# Patient Record
Sex: Female | Born: 1945 | Race: White | Hispanic: No | Marital: Married | Smoking: Former smoker
Health system: Southern US, Community
[De-identification: ages and names within clinical notes are randomized; demographics above are authoritative.]

## PROBLEM LIST (undated history)

## (undated) DIAGNOSIS — T4145XA Adverse effect of unspecified anesthetic, initial encounter: Secondary | ICD-10-CM

## (undated) DIAGNOSIS — E039 Hypothyroidism, unspecified: Secondary | ICD-10-CM

## (undated) DIAGNOSIS — E785 Hyperlipidemia, unspecified: Secondary | ICD-10-CM

## (undated) DIAGNOSIS — I10 Essential (primary) hypertension: Secondary | ICD-10-CM

## (undated) DIAGNOSIS — F419 Anxiety disorder, unspecified: Secondary | ICD-10-CM

## (undated) DIAGNOSIS — I639 Cerebral infarction, unspecified: Secondary | ICD-10-CM

## (undated) DIAGNOSIS — T8859XA Other complications of anesthesia, initial encounter: Secondary | ICD-10-CM

## (undated) DIAGNOSIS — G4733 Obstructive sleep apnea (adult) (pediatric): Secondary | ICD-10-CM

## (undated) DIAGNOSIS — M545 Low back pain: Secondary | ICD-10-CM

## (undated) HISTORY — PX: ABDOMINAL HYSTERECTOMY: SHX81

## (undated) HISTORY — PX: VARICOSE VEIN SURGERY: SHX832

## (undated) HISTORY — PX: CARPAL TUNNEL RELEASE: SHX101

## (undated) HISTORY — PX: REVISION TOTAL HIP ARTHROPLASTY: SHX766

## (undated) HISTORY — PX: TONSILLECTOMY: SUR1361

## (undated) HISTORY — PX: TOTAL HIP ARTHROPLASTY: SHX124

## (undated) HISTORY — DX: Low back pain: M54.5

---

## 2006-06-09 ENCOUNTER — Encounter: Admission: RE | Admit: 2006-06-09 | Discharge: 2006-06-09 | Payer: Self-pay | Admitting: Family Medicine

## 2007-06-17 ENCOUNTER — Encounter: Admission: RE | Admit: 2007-06-17 | Discharge: 2007-06-17 | Payer: Self-pay | Admitting: Family Medicine

## 2007-08-05 ENCOUNTER — Encounter: Admission: RE | Admit: 2007-08-05 | Discharge: 2007-08-05 | Payer: Self-pay | Admitting: Family Medicine

## 2007-08-18 ENCOUNTER — Ambulatory Visit (HOSPITAL_BASED_OUTPATIENT_CLINIC_OR_DEPARTMENT_OTHER): Admission: RE | Admit: 2007-08-18 | Discharge: 2007-08-18 | Payer: Self-pay | Admitting: Surgery

## 2007-08-18 ENCOUNTER — Encounter (INDEPENDENT_AMBULATORY_CARE_PROVIDER_SITE_OTHER): Payer: Self-pay | Admitting: Surgery

## 2008-06-21 ENCOUNTER — Encounter: Admission: RE | Admit: 2008-06-21 | Discharge: 2008-06-21 | Payer: Self-pay | Admitting: Family Medicine

## 2008-08-13 ENCOUNTER — Inpatient Hospital Stay (HOSPITAL_COMMUNITY): Admission: RE | Admit: 2008-08-13 | Discharge: 2008-08-18 | Payer: Self-pay | Admitting: Orthopedic Surgery

## 2009-04-03 ENCOUNTER — Ambulatory Visit (HOSPITAL_COMMUNITY): Admission: RE | Admit: 2009-04-03 | Discharge: 2009-04-03 | Payer: Self-pay | Admitting: Orthopedic Surgery

## 2009-04-29 ENCOUNTER — Encounter: Admission: RE | Admit: 2009-04-29 | Discharge: 2009-04-29 | Payer: Self-pay | Admitting: Internal Medicine

## 2009-08-05 ENCOUNTER — Encounter: Admission: RE | Admit: 2009-08-05 | Discharge: 2009-08-05 | Payer: Self-pay | Admitting: Orthopedic Surgery

## 2009-08-12 ENCOUNTER — Encounter: Admission: RE | Admit: 2009-08-12 | Discharge: 2009-08-12 | Payer: Self-pay | Admitting: Radiation Oncology

## 2009-11-02 ENCOUNTER — Emergency Department (HOSPITAL_COMMUNITY): Admission: EM | Admit: 2009-11-02 | Discharge: 2009-11-02 | Payer: Self-pay | Admitting: Emergency Medicine

## 2010-02-06 IMAGING — NM NM BONE 3 PHASE
1 series · 6 of 6 positions shown · non-contrast
Comparison: None

CLINICAL DATA: Right hip pain - evaluate for prosthetic loosening.
Right hip replacement July 2008.

NUCLEAR MEDICINE THREE PHASE BONE SCAN
TECHNIQUE: Radionuclide angiographic images, immediate static
blood pool images, and 3-hour delayed static images were obtained
after intravenous injection of radiopharmaceutical.
Radiopharmaceutical: 23.4 mCi technetium MDP

[Series 1: fl flow and static · 4.75mm/px · 6 of 41 frames shown]
[frame 4/41  full-range]
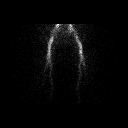
[frame 10/41  full-range]
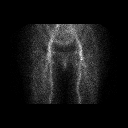
[frame 17/41  full-range]
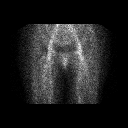
[frame 24/41  full-range]
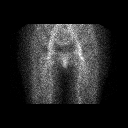
[frame 31/41  full-range]
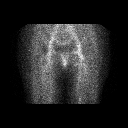
[frame 38/41  full-range]
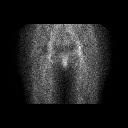

[6 of 6 positions shown; findings below may reference images not displayed]

FINDINGS: No pathological findings are noted in the vascular and
blood pool phases.

In the static phase, there is focal positivity at the tip of the
prosthesis in the mid shaft of the femur.  There is also perhaps
slight increased uptake in the area of the greater trochanter
adjacent to the prosthesis.

The scintigraphic findings are suspicious for prosthetic loosening.
However, bony remodeling can take place within the first year after
prosthesis, and so it is possible that this is a normal
postoperative finding.  It is difficult to make this distinction
only 8 months postoperative.  I would be happy to discussed this
with you in light of any other pertinent clinical data.
IMPRESSION: Focal positivity noted at the tip of the prosthesis, and in the
trochanteric region adjacent to the prosthesis - the findings are
suspicious but not definitive for prosthetic loosening.  See
report.

## 2010-04-30 ENCOUNTER — Encounter: Admission: RE | Admit: 2010-04-30 | Discharge: 2010-04-30 | Payer: Self-pay | Admitting: Internal Medicine

## 2010-05-26 ENCOUNTER — Encounter (INDEPENDENT_AMBULATORY_CARE_PROVIDER_SITE_OTHER): Payer: Self-pay | Admitting: Orthopedic Surgery

## 2010-05-26 ENCOUNTER — Inpatient Hospital Stay (HOSPITAL_COMMUNITY): Admission: RE | Admit: 2010-05-26 | Discharge: 2010-05-31 | Payer: Self-pay | Admitting: Orthopedic Surgery

## 2010-07-30 ENCOUNTER — Other Ambulatory Visit: Payer: Self-pay | Admitting: Internal Medicine

## 2010-07-30 DIAGNOSIS — R42 Dizziness and giddiness: Secondary | ICD-10-CM

## 2010-07-30 DIAGNOSIS — R519 Headache, unspecified: Secondary | ICD-10-CM

## 2010-07-30 DIAGNOSIS — R269 Unspecified abnormalities of gait and mobility: Secondary | ICD-10-CM

## 2010-08-01 ENCOUNTER — Inpatient Hospital Stay (HOSPITAL_COMMUNITY)
Admission: EM | Admit: 2010-08-01 | Discharge: 2010-08-06 | DRG: 533 | Disposition: A | Discharge status: Home Health | Payer: BC Managed Care – PPO | Attending: Internal Medicine | Admitting: Internal Medicine

## 2010-08-01 ENCOUNTER — Emergency Department (HOSPITAL_COMMUNITY): Payer: BC Managed Care – PPO

## 2010-08-01 ENCOUNTER — Ambulatory Visit
Admission: RE | Admit: 2010-08-01 | Discharge: 2010-08-01 | Disposition: A | Discharge status: Home / Self Care | Payer: BC Managed Care – PPO | Source: Ambulatory Visit

## 2010-08-01 DIAGNOSIS — Z780 Asymptomatic menopausal state: Secondary | ICD-10-CM

## 2010-08-01 DIAGNOSIS — E785 Hyperlipidemia, unspecified: Secondary | ICD-10-CM | POA: Diagnosis present

## 2010-08-01 DIAGNOSIS — Z79899 Other long term (current) drug therapy: Secondary | ICD-10-CM

## 2010-08-01 DIAGNOSIS — R42 Dizziness and giddiness: Secondary | ICD-10-CM

## 2010-08-01 DIAGNOSIS — Z7982 Long term (current) use of aspirin: Secondary | ICD-10-CM

## 2010-08-01 DIAGNOSIS — B004 Herpesviral encephalitis: Principal | ICD-10-CM | POA: Diagnosis present

## 2010-08-01 DIAGNOSIS — I6350 Cerebral infarction due to unspecified occlusion or stenosis of unspecified cerebral artery: Secondary | ICD-10-CM | POA: Diagnosis present

## 2010-08-01 DIAGNOSIS — E039 Hypothyroidism, unspecified: Secondary | ICD-10-CM | POA: Diagnosis present

## 2010-08-01 DIAGNOSIS — G43909 Migraine, unspecified, not intractable, without status migrainosus: Secondary | ICD-10-CM | POA: Diagnosis present

## 2010-08-01 DIAGNOSIS — I10 Essential (primary) hypertension: Secondary | ICD-10-CM | POA: Diagnosis present

## 2010-08-01 DIAGNOSIS — R269 Unspecified abnormalities of gait and mobility: Secondary | ICD-10-CM

## 2010-08-01 DIAGNOSIS — J42 Unspecified chronic bronchitis: Secondary | ICD-10-CM | POA: Diagnosis present

## 2010-08-01 DIAGNOSIS — F411 Generalized anxiety disorder: Secondary | ICD-10-CM | POA: Diagnosis present

## 2010-08-01 DIAGNOSIS — Z96649 Presence of unspecified artificial hip joint: Secondary | ICD-10-CM

## 2010-08-01 DIAGNOSIS — Z87891 Personal history of nicotine dependence: Secondary | ICD-10-CM

## 2010-08-01 DIAGNOSIS — G939 Disorder of brain, unspecified: Secondary | ICD-10-CM | POA: Diagnosis present

## 2010-08-01 LAB — DIFFERENTIAL
Eosinophils Relative: 6 % — ABNORMAL HIGH (ref 0–5)
Lymphocytes Relative: 45 % (ref 12–46)
Lymphs Abs: 2.9 10*3/uL (ref 0.7–4.0)
Monocytes Relative: 8 % (ref 3–12)

## 2010-08-01 LAB — COMPREHENSIVE METABOLIC PANEL
Alkaline Phosphatase: 76 U/L (ref 39–117)
BUN: 19 mg/dL (ref 6–23)
Chloride: 102 mEq/L (ref 96–112)
Creatinine, Ser: 0.96 mg/dL (ref 0.4–1.2)
Glucose, Bld: 103 mg/dL — ABNORMAL HIGH (ref 70–99)
Potassium: 3.5 mEq/L (ref 3.5–5.1)
Total Bilirubin: 0.3 mg/dL (ref 0.3–1.2)

## 2010-08-01 LAB — CBC
HCT: 37 % (ref 36.0–46.0)
MCH: 28.5 pg (ref 26.0–34.0)
MCV: 85.8 fL (ref 78.0–100.0)
RBC: 4.31 MIL/uL (ref 3.87–5.11)
RDW: 12.8 % (ref 11.5–15.5)
WBC: 6.5 10*3/uL (ref 4.0–10.5)

## 2010-08-01 LAB — APTT: aPTT: 28 seconds (ref 24–37)

## 2010-08-01 LAB — PROTIME-INR: INR: 0.99 (ref 0.00–1.49)

## 2010-08-01 MED ORDER — GADOBENATE DIMEGLUMINE 529 MG/ML IV SOLN
17.0000 mL | Freq: Once | INTRAVENOUS | Status: AC | PRN
  Administered 2010-08-01: 17 mL via INTRAVENOUS

## 2010-08-02 ENCOUNTER — Other Ambulatory Visit (HOSPITAL_COMMUNITY): Payer: BC Managed Care – PPO

## 2010-08-02 LAB — LIPID PANEL
LDL Cholesterol: 91 mg/dL (ref 0–99)
Triglycerides: 151 mg/dL — ABNORMAL HIGH (ref <150)

## 2010-08-02 LAB — T4, FREE: Free T4: 1.09 ng/dL (ref 0.80–1.80)

## 2010-08-02 LAB — TSH: TSH: 4.221 u[IU]/mL (ref 0.350–4.500)

## 2010-08-02 NOTE — H&P (Signed)
NAME:  Maureen Burch, Maureen Burch NO.:  [id number]  MEDICAL RECORD NO.:  [id number]           PATIENT TYPE:  I  LOCATION:  3002                         FACILITY:  [facility]  PHYSICIAN:  Hillery Aldo, M.D.   DATE OF BIRTH:  08-Jun-1946  DATE OF ADMISSION:  08/01/2010 DATE OF DISCHARGE:                             HISTORY & PHYSICAL   PRIMARY CARE PHYSICIAN:  Massie Maroon, MD  CHIEF COMPLAINT:  Headache x10 days.  HISTORY OF PRESENT ILLNESS:  The patient is a 65 year old female with past medical history of tobacco abuse, hypertension, and hyperlipidemia who presented to her PCP approximately 10 days ago with a chief complaint of headache.  The patient was felt to possibly have migraine- type headache and was treated with Topamax and Maxalt, but experienced no relief with these medications.  She denies any associated diplopia, focal neurologic deficits, speech or swallowing difficulty, but does report that in the past few months, she has had intermittent numbness and tingling involving her right upper extremity.  She also reports that earlier today, she had a 2-hour period of time where she noticed some left facial asymmetry and her left eyebrow not rising normally.  The patient's primary care physician sent her for an MRI scan due to ongoing problems with headaches and the MRI showed an acute versus subacute stroke involving the right temporal lobe.  She subsequently was instructed to come to the hospital for further evaluation and treatment.  PAST MEDICAL HISTORY: 1. Hypothyroidism. 2. Chronic bronchitis. 3. Hypertension. 4. Generalized anxiety disorder. 5. Dyslipidemia.  PAST SURGICAL HISTORY: 1. 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 2. Hysterectomy. 3. Appendectomy. 4. Tonsillectomy.  FAMILY HISTORY:  The patient's mother died at 14 from pneumonia.  The patient's father died at age 52 from cancer.  She has four siblings. Two brothers and two  sisters.  One sister has breast cancer and diabetes, but the others are healthy.  SOCIAL HISTORY:  The patient is married and lives with her husband in [place].  She works as an Retail buyer.  She has a history of tobacco abuse, but quit smoking approximately 2 years ago.  Drinks alcohol on only social occasions.  Denies drug use.  ALLERGIES:  No known drug allergies.  CURRENT MEDICATIONS: 1. Lisinopril 20 mg p.o. b.i.d. 2. Aspirin 81 mg p.o. daily. 3. Xanax 0.25 mg p.o. b.i.d. 4. Atenolol 25 mg p.o. daily. 5. Ventolin inhaler 2 puffs daily p.r.n. 6. Premarin 0.625 mg p.o. daily. 7. Synthroid 75 mcg p.o. daily. 8. Hydrochlorothiazide 25 mg p.o. daily. 9. Amitriptyline 25 mg p.o. b.i.d.  REVIEW OF SYSTEMS:  CONSTITUTIONAL:  No fever or chills.  No weight loss.  She does report diminished appetite and diminished energy. HEENT:  No complaints except her eyebrow being noticeably weak earlier today.  No dysphagia, dysphonia, or other symptoms.  CARDIOVASCULAR:  No chest pain or palpitations.  RESPIRATORY:  No dyspnea or cough.  GI:  No nausea, vomiting, diarrhea, melena, or hematochezia.  GU:  No dysuria.  All other systems reviewed and are negative.  PHYSICAL EXAMINATION:  VITAL SIGNS:  Temperature 97.9, pulse 58, respirations  16, blood pressure 140/77, O2 saturation 99% on room air. GENERAL:  Well-developed, well-nourished Caucasian female in no acute distress. HEENT:  Normocephalic, atraumatic.  PERRL.  EOMI.  Visual fields are full.  Tongue is midline.  Palate rises symmetrically.  Possible subtle left facial asymmetry. NECK:  Supple, no thyromegaly, no lymphadenopathy, no jugular venous distention. CHEST:  Lungs clear to auscultation bilaterally with good air movement. HEART:  Regular rate, rhythm.  No murmurs, rubs, or gallops. ABDOMEN:  Soft, nontender, nondistended with normoactive bowel sounds. EXTREMITIES:  No clubbing, edema, or cyanosis. SKIN:  Warm and dry.   No rashes.  Melanin pigmentation noted consistent with possible vitiligo. NEUROLOGIC:  The patient is alert and oriented x3.  Cranial nerves II through XII are grossly intact with the exception of a possible subtle left facial weakness.  No pronator drift.  Moves all extremities x4 with equal strength though the right lower extremity is somewhat weak secondary to her history of recent hip surgery.  DATA REVIEW:  MRI done earlier today shows abnormality in the medial right temporal lobe, most suggestive of acute/subacute infarcts. Prominent diffuse white matter type changes consistent with small vessel disease/chronic ischemia.  Paranasal sinus mucosal thickening with air- fluid levels left sphenoid sinus and left maxillary sinus.  LABORATORY DATA:  White blood cell count 6.5, hemoglobin 12.3, hematocrit 37, platelets 200.  All other labs are pending.  ASSESSMENT/PLAN:  Right cerebrovascular accident:  We will admit the patient to telemetry and obtain a neurological consultation.  Full stroke workup with a 12-lead EKG, two-dimensional echocardiogram, carotid Dopplers, transcranial Dopplers, and MRA of the brain has been requested.     Hillery Aldo, M.D.     CR/MEDQ  D:  08/01/2010  T:  08/02/2010  Job:  [id number]  cc:   Massie Maroon, MD  Electronically Signed by Hillery Aldo M.D. on 08/02/2010 01:39:32 PM

## 2010-08-02 NOTE — H&P (Signed)
  NAME:  Maureen Burch, Maureen Burch NO.:  [id number]  MEDICAL RECORD NO.:  [id number]           PATIENT TYPE:  I  LOCATION:  3002                         FACILITY:  [facility]  PHYSICIAN:  Hillery Aldo, M.D.   DATE OF BIRTH:  01/24/1946  DATE OF ADMISSION:  08/01/2010 DATE OF DISCHARGE:                             HISTORY & PHYSICAL   ADDENDUM  1. Hypertension:  Continue the patient's usual antihypertensives and     monitor her blood pressure closely. 2. History of dyslipidemia:  The patient is not currently on any     medications for lipid control.  We will check a fasting lipid panel     in the morning to ensure that she does not need further treatment. 3. Generalized anxiety disorder:  Continue the patient's Xanax. 4. Chronic bronchitis:  Continue the patient's Ventolin inhaler as     needed. 5. Hypothyroidism:  Check the patient's TSH, free T4, and continue her     on Synthroid therapy. 6. Postmenopausal status:  The patient has been on Premarin for     treatment of menopausal symptoms.  We will stop this given evidence     of CVA. 7. Prophylaxis:  We will place the patient on Lovenox subcutaneously     for DVT prophylaxis.  Time spent on admission including face-to-face time approximately 1 hour.     Hillery Aldo, M.D.     CR/MEDQ  D:  08/01/2010  T:  08/02/2010  Job:  [id number]  Electronically Signed by Hillery Aldo M.D. on 08/02/2010 01:39:38 PM

## 2010-08-03 LAB — URINALYSIS, ROUTINE W REFLEX MICROSCOPIC
Hgb urine dipstick: NEGATIVE
Ketones, ur: NEGATIVE mg/dL
Protein, ur: NEGATIVE mg/dL
Urine Glucose, Fasting: NEGATIVE mg/dL

## 2010-08-03 LAB — COMPREHENSIVE METABOLIC PANEL
ALT: 17 U/L (ref 0–35)
AST: 19 U/L (ref 0–37)
Albumin: 3 g/dL — ABNORMAL LOW (ref 3.5–5.2)
Alkaline Phosphatase: 61 U/L (ref 39–117)
GFR calc Af Amer: >60 mL/min (ref >60)
Potassium: 3.7 mEq/L (ref 3.5–5.1)
Sodium: 140 mEq/L (ref 135–145)
Total Protein: 5.9 g/dL — ABNORMAL LOW (ref 6.0–8.3)

## 2010-08-04 ENCOUNTER — Other Ambulatory Visit: Payer: Self-pay | Admitting: Radiology

## 2010-08-04 ENCOUNTER — Inpatient Hospital Stay (HOSPITAL_COMMUNITY): Payer: BC Managed Care – PPO

## 2010-08-04 LAB — CSF CELL COUNT WITH DIFFERENTIAL
RBC Count, CSF: 3 /mm3 — ABNORMAL HIGH
Tube #: 1

## 2010-08-04 LAB — ANGIOTENSIN CONVERTING ENZYME: Angiotensin-Converting Enzyme: 2 U/L — ABNORMAL LOW (ref 8–52)

## 2010-08-04 LAB — C3 COMPLEMENT: C3 Complement: 98 mg/dL (ref 88–201)

## 2010-08-04 LAB — SEDIMENTATION RATE: Sed Rate: 14 mm/hr (ref 0–22)

## 2010-08-04 LAB — C4 COMPLEMENT: Complement C4, Body Fluid: 19 mg/dL (ref 16–47)

## 2010-08-05 LAB — SJOGRENS SYNDROME-A EXTRACTABLE NUCLEAR ANTIBODY: SSA (Ro) (ENA) Antibody, IgG: 15 AU/mL (ref <30)

## 2010-08-06 ENCOUNTER — Other Ambulatory Visit: Payer: Self-pay

## 2010-08-06 LAB — ANTI-NEUTROPHIL ANTIBODY

## 2010-08-06 LAB — ANGIOTENSIN CONVERTING ENZYME, CSF: Angio Convert Enzyme: 5 U/L (ref <=15)

## 2010-08-06 LAB — COMPLEMENT, TOTAL: Compl, Total (CH50): 50 U/mL (ref 31–60)

## 2010-08-08 LAB — CSF CULTURE W GRAM STAIN: Culture: NO GROWTH

## 2010-08-08 LAB — HERPES SIMPLEX VIRUS(HSV) DNA BY PCR: HSV 1 DNA: NOT DETECTED

## 2010-08-09 LAB — OLIGOCLONAL BANDS, CSF + SERM

## 2010-08-12 ENCOUNTER — Other Ambulatory Visit: Payer: Self-pay | Admitting: Internal Medicine

## 2010-08-13 LAB — CNS IGG SYNTHESIS RATE, CSF+BLOOD
IgG Index, CSF: 0.5 (ref <0.66)
IgG, CSF: 1.9 mg/dL (ref 0.8–7.7)

## 2010-08-18 ENCOUNTER — Ambulatory Visit
Admission: RE | Admit: 2010-08-18 | Discharge: 2010-08-18 | Disposition: A | Discharge status: Home / Self Care | Payer: BC Managed Care – PPO | Source: Ambulatory Visit | Attending: Internal Medicine | Admitting: Internal Medicine

## 2010-08-18 MED ORDER — GADOBENATE DIMEGLUMINE 529 MG/ML IV SOLN
16.0000 mL | Freq: Once | INTRAVENOUS | Status: AC | PRN
  Administered 2010-08-18: 16 mL via INTRAVENOUS

## 2010-08-20 NOTE — Discharge Summary (Signed)
NAME:  Maureen, Burch NO.:  [id number]  MEDICAL RECORD NO.:  [id number]           PATIENT TYPE:  I  LOCATION:  3002                         FACILITY:  [facility]  PHYSICIAN:  Massie Maroon, MD        DATE OF BIRTH:  17-Nov-1945  DATE OF ADMISSION:  08/01/2010 DATE OF DISCHARGE:                              DISCHARGE SUMMARY   DISCHARGE DIAGNOSES: 1. Gyriform contrast enhancement of the right medial temporal lobe,     most likely differential diagnosis acute or subacute infarction,     possible herpes encephalitis or mass lesion in the differential. 2. Headache, likely secondary to cerebrovascular accident or     inflammation. 3. Hypertension. 4. Hyperlipidemia. 5. Hypothyroidism. 6. General anxiety disorder. 7. 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 8. Hysterectomy. 9. Appendectomy. 10.Tonsillectomy.  PROCEDURES: 1. MRI of brain without contrast on August 01, 2010, abnormality in     the right medial temporal lobe suggestive of acute or subacute     infarct.  August 02, 2010, MRI of brain with contrast, gyriform     enhancement of the medial temporal lobe on the right, most likely     acute or subacute infarction, differential herpes encephalitis or     mass lesion.  MRA on August 02, 2010, negative MRA. 2. Lumbar puncture on August 04, 2010.  CONSULTANTS: 1. Joycelyn Schmid, MD. 2. Reinaldo Meeker, MD.  DISCHARGE MEDICATIONS: 1. Elavil 50 mg p.o. at bedtime. 2. Enteric-coated aspirin 325 mg p.o. daily. 3. Vicodin 5/325 one to two p.o. q.4 h. p.r.n. headache. 4. Lisinopril 5 mg p.o. b.i.d. 5. Crestor 5 mg p.o. daily. 6. Senna-S tablet one p.o. at bedtime p.r.n. 7. Xanax 0.25 mg p.o. b.i.d. 8. Elavil 25 mg two p.o. at bedtime. 9. Atenolol 25 mg one p.o. b.i.d. 10.Coenzyme Q10 one p.o. daily. 11.Fish oil two p.o. daily. 12.Levothyroxine 75 mcg p.o. daily. 13.Premarin 0.625 mg one p.o. q.a.m. 14.Singulair 10 mg p.o.  daily.  HOSPITAL COURSE:  A 65 year old right-handed female with a history of hypertension, migraine headache apparently was in her normal state of health until about approximately 10 days ago when she complained of headache primarily.  Prior to that, she had reported some intermittent numbness and tingling of her right upper extremity and some blurred vision and diaphoresis [place] the post office.  The patient was seen by Dr. Selena Batten and an attempt was made to treat her headaches with Maxalt and topiramate, which did not relieve her headaches.  Headache was progressively worsened.  Imaging was performed, which showed possible acute/subacute stroke, differential diagnosis being inflammatory process versus tumor.  Neurology was consulted and thought that later in the course of her hospitalization that her symptoms were atypical for stroke and a lumbar puncture was performed on August 04, 2010.  Some of the results are still outstanding, however, today HIV antibody test, complement levels, protein, glucose, cell count, diff, Gram stain and culture, cryptococcal antigen, ACE converting enzyme, fungal culture, ANA were negative.  The patient has had some difficulty with gait on day two of admission, however, this has improved and she is  almost back to her baseline.  Dr. Gerlene Fee also consulted on this case and recommended followup MRI in about 3-4 weeks to rule out tumor.  He thought tumor was of low probability.  No neurosurgical intervention at this time.  The patient is able to walk with walker and appears steady.  PHYSICAL EXAM:  VITAL SIGNS:  Temperature 97.8, pulse 63, blood pressure 114/77, pulse ox 96% on room air. HEENT:  Anicteric. NECK:  No JVD.  No bruit. HEART:  Regular rate and rhythm, S1 and S2. LUNGS:  Clear to auscultation bilaterally. ABDOMEN:  Soft, nontender, nondistended.  Positive bowel sounds. EXTREMITIES:  No cyanosis, clubbing, or edema. SKIN:  No rashes, evidence of  vitiligo.  LABORATORY DATA:  Labs, August 01, 2010, hemoglobin A1c 5.3.  August 01, 2010, TSH 4.221.  August 04, 2010, ESR 14.  C-reactive protein 1.1. HIV antibody test nonreactive.  C3 of 98, C4 of 19.  Protein and glucose from the CSF 52 and 24 respectively (normal).  Cell count; WBC is 1, RBC is 3.  Cryptococcal antigen negative.  ACE converting enzyme 2.  Fungal culture negative.  ANA negative.  ANCA pending.  Outstanding are oligoclonal bands, herpes simplex virus, CNS IgG synthesis rate.  ASSESSMENT/PLAN:  Right medial temporal lobe abnormality, likely acute or subacute infarct, differential diagnosis inflammatory process or tumor.  Repeat MRI suggested by Neurosurgery in 3-4 weeks.  We will arrange this as an outpatient.  Obviously, we are awaiting some of the CSF results and these can be followed up as an outpatient.  We greatly appreciate Dr. Satira Sark Penumalli's input as well as Dr. Trudee Grip.  The patient will follow up with Dr. Selena Batten in 1 week for followup of CSF test results and Dr. Joycelyn Schmid in 1-2 weeks as well.     Massie Maroon, MD     JYK/MEDQ  D:  08/06/2010  T:  08/06/2010  Job:  [id number]  Electronically Signed by Pearson Grippe MD on 08/20/2010 06:44:19 AM

## 2010-08-21 LAB — VIRUS CULTURE

## 2010-09-01 LAB — FUNGUS CULTURE W SMEAR

## 2010-09-01 NOTE — Consult Note (Signed)
NAME:  Maureen, Burch NO.:  [id number]  MEDICAL RECORD NO.:  [id number]           PATIENT TYPE:  E  LOCATION:  [facility]                         FACILITY:  [facility]  PHYSICIAN:  Joycelyn Schmid, MD   DATE OF BIRTH:  26-Feb-1946  DATE OF CONSULTATION:  08/01/2010 DATE OF DISCHARGE:                                CONSULTATION   CHIEF COMPLAINT:  Headache, blurred vision, abnormal MRI of brain.  HISTORY OF PRESENT ILLNESS:  A 65 year old right-handed female with history of hypertension, migraine headache, "irregular heartbeat," was in normal state of health until July 23, 2010, when she was [place] the post office, leaned over and seat back up, developed diffuse blurred vision and feeling sweaty sensation over her face.  She then developed right posterior headache that was intermittent.  The headache was intermittent over the next several days and stronger in intensity than her usual migraine headache.  She had no nausea or vomiting.  Her headaches also would wake her up in the middle of night.  This was atypical from her usual migraine headaches.  She saw her primary care physician Dr. Selena Batten who prescribed Maxalt and topiramate, which did not relieve for headaches.  She also developed abnormal tingling sensation in the bilateral hands after starting topiramate.  Headache progressively got worse and she also had some balance difficulty, her husband who is here in the room with her has also noted some mild intermittent memory difficulties over the past 1-2 months, but the patient adamantly denies that she has had any memory difficulties.  She denies any other focal or sudden onset of symptoms such as numbness, weakness, slurred speech, language difficulty or vision abnormalities.  PAST MEDICAL HISTORY: 1. Hypertension. 2. "Irregular heart rhythm," for which, she is treated with atenolol,     alprazolam, and amitriptyline. 3. History of migraine headaches since age 56 years  old. 4. Right hip replacement surgeries in 2010 and 2011.  MEDICATIONS: 1. Hydrochlorothiazide. 2. Atenolol. 3. Lisinopril. 4. Alprazolam. 5. Amitriptyline. 6. Aspirin 81 mg daily. 7. Levothyroxine. 8. Ventolin inhaler. 9. Singulair.  ALLERGIES:  No known drug allergies.  FAMILY HISTORY:  None.  SOCIAL HISTORY:  The patient quit smoking in 2009, quit alcohol in 2008. No illicit drug use.  REVIEW OF SYSTEMS:  The patient has had some weight gain recently since she has not been able to exercise with her hip surgeries.  She denies any night sweats, denies chest pain or shortness of breath.  Denies any rash.  She has been extremely fatigued over the past 10 days.  Other full 14 system review is negative.  PHYSICAL EXAMINATION:  VITAL SIGNS:  Blood pressure 140/77, pulse of 54, respirations 21, temperature 97.9 degrees, 99% of room air. GENERAL EXAMINATION:  She is awake and alert.  Language is fluent. Comprehension intact.  Mental status; no evidence of aphasia.  She is able to read sentences and words without difficulty, able to describe name, objects and describe the NIH stroke scale, cookie-theft picture. Cranial nerve examination; no papilledema on funduscopic exam.  Pupils are reactive 3-2 mm.  Visual fields full to accommodation.  Extraocular muscles  intact.  No nystagmus.  Facial sensation and strength are symmetric.  Uvula midline.  Shoulder shrug is symmetric.  Tongue is midline.  Motor examination; normal bulk and tone, 5/5 strength in bilateral upper and lower extremities.  Sensory examination; intact to light touch, temperature, vibration.  Reflexes 1+ in the upper and lower extremities.  Downgoing toes.  Coordination testing; finger-nose-finger smooth and symmetric.  Gait testing; the patient is slow and cautious to sit up at the side of the bed and stand up.  She is able to walk on her own power back-and-forth to the room, although she is quite slow and careful  with her walking.  Her husband notes that this is abnormal for her usual mobility. CARDIOVASCULAR:  Regular rate and rhythm.  No murmurs.  No carotid bruits.  LAB TESTING:  Sodium 138, BUN 19, creatinine 0.96, glucose 103.  White count 6.5, platelets 200.  Troponin 0.03.  LFTs normal.  MRI scan of brain, which I reviewed, shows a right mesial temporal lobe lesion with some components of restricted fusion and vasogenic edema.  Mesial temporal lobe structure appears slightly enlarged as compared to the left side.  There are also numerous small T2 hyperintensity scattered throughout the bilateral cerebral hemispheres and brainstem.  ASSESSMENT AND RECOMMENDATIONS:  A 65 year old female with progressive intermittent headache and malaise.  No focal findings.  She had an abnormal MRI scan.  Differential diagnosis includes central nervous system neoplasm, inflammatory lesion, autoimmune condition, stroke. Based on the patient's symptoms and neurologic exam, I am less convinced that this is an acute ischemic infarction or acute vascular event and much more concerned for in inflammatory or neoplastic process.  RECOMMENDATIONS: 1. I agree with completing stroke workup with transthoracic     echocardiogram, carotid ultrasound, fasting lipid profile, MRA of     the head. 2. Add on postcontrast use of MRI of the brain. 3. Headache control with pain medications.  Would try ibuprofen,     Tylenol, Fioricet, tramadol, Toradol, other opiates as needed. 4. At some point, the patient should have a neurosurgical consultation     if the above workup is completed and unremarkable, and unrevealing     for the diagnosis, this could probably be done on an outpatient     basis.     Joycelyn Schmid, MD    VP/MEDQ  D:  08/01/2010  T:  08/01/2010  Job:  [id number]  Electronically Signed by Joycelyn Schmid  on 09/01/2010 12:33:33 PM

## 2010-09-04 ENCOUNTER — Other Ambulatory Visit: Payer: Self-pay | Admitting: Internal Medicine

## 2010-09-04 DIAGNOSIS — Z1231 Encounter for screening mammogram for malignant neoplasm of breast: Secondary | ICD-10-CM

## 2010-09-09 LAB — CBC
HCT: 26.6 % — ABNORMAL LOW (ref 36.0–46.0)
Hemoglobin: 11.6 g/dL — ABNORMAL LOW (ref 12.0–15.0)
Hemoglobin: 8.8 g/dL — ABNORMAL LOW (ref 12.0–15.0)
MCH: 30.1 pg (ref 26.0–34.0)
MCH: 30.3 pg (ref 26.0–34.0)
MCHC: 33.1 g/dL (ref 30.0–36.0)
Platelets: 154 10*3/uL (ref 150–400)
Platelets: 171 10*3/uL (ref 150–400)
Platelets: 180 10*3/uL (ref 150–400)
Platelets: 269 10*3/uL (ref 150–400)
RBC: 2.64 MIL/uL — ABNORMAL LOW (ref 3.87–5.11)
RBC: 2.89 MIL/uL — ABNORMAL LOW (ref 3.87–5.11)
RBC: 2.95 MIL/uL — ABNORMAL LOW (ref 3.87–5.11)
RBC: 3.86 MIL/uL — ABNORMAL LOW (ref 3.87–5.11)
RDW: 12.8 % (ref 11.5–15.5)
RDW: 13 % (ref 11.5–15.5)
WBC: 6.4 10*3/uL (ref 4.0–10.5)
WBC: 9.2 10*3/uL (ref 4.0–10.5)

## 2010-09-09 LAB — PROTIME-INR
INR: 0.91 (ref 0.00–1.49)
INR: 1.13 (ref 0.00–1.49)
INR: 1.19 (ref 0.00–1.49)
INR: 1.56 — ABNORMAL HIGH (ref 0.00–1.49)
INR: 1.63 — ABNORMAL HIGH (ref 0.00–1.49)
INR: 2.06 — ABNORMAL HIGH (ref 0.00–1.49)
Prothrombin Time: 12.5 seconds (ref 11.6–15.2)
Prothrombin Time: 14.7 seconds (ref 11.6–15.2)
Prothrombin Time: 15.3 seconds — ABNORMAL HIGH (ref 11.6–15.2)
Prothrombin Time: 16.7 seconds — ABNORMAL HIGH (ref 11.6–15.2)
Prothrombin Time: 18.9 seconds — ABNORMAL HIGH (ref 11.6–15.2)
Prothrombin Time: 19.5 seconds — ABNORMAL HIGH (ref 11.6–15.2)
Prothrombin Time: 23.4 seconds — ABNORMAL HIGH (ref 11.6–15.2)

## 2010-09-09 LAB — TYPE AND SCREEN
ABO/RH(D): A POS
Antibody Screen: NEGATIVE
Unit division: 0
Unit division: 0

## 2010-09-09 LAB — URINALYSIS, ROUTINE W REFLEX MICROSCOPIC
Bilirubin Urine: NEGATIVE
Glucose, UA: NEGATIVE mg/dL
Hgb urine dipstick: NEGATIVE
Protein, ur: NEGATIVE mg/dL

## 2010-09-09 LAB — BASIC METABOLIC PANEL
BUN: 12 mg/dL (ref 6–23)
CO2: 28 mEq/L (ref 19–32)
CO2: 30 mEq/L (ref 19–32)
Calcium: 8.1 mg/dL — ABNORMAL LOW (ref 8.4–10.5)
Calcium: 8.3 mg/dL — ABNORMAL LOW (ref 8.4–10.5)
Calcium: 8.4 mg/dL (ref 8.4–10.5)
Calcium: 9.8 mg/dL (ref 8.4–10.5)
Chloride: 106 mEq/L (ref 96–112)
Creatinine, Ser: 0.86 mg/dL (ref 0.4–1.2)
Creatinine, Ser: 0.92 mg/dL (ref 0.4–1.2)
Creatinine, Ser: 0.94 mg/dL (ref 0.4–1.2)
GFR calc Af Amer: >60 mL/min (ref >60)
GFR calc Af Amer: >60 mL/min (ref >60)
GFR calc non Af Amer: 60 mL/min — ABNORMAL LOW (ref >60)
GFR calc non Af Amer: >60 mL/min (ref >60)
GFR calc non Af Amer: >60 mL/min (ref >60)
Glucose, Bld: 109 mg/dL — ABNORMAL HIGH (ref 70–99)
Sodium: 137 mEq/L (ref 135–145)

## 2010-09-09 LAB — DIFFERENTIAL
Basophils Relative: 0 % (ref 0–1)
Eosinophils Absolute: 0.5 10*3/uL (ref 0.0–0.7)
Lymphs Abs: 4.3 10*3/uL — ABNORMAL HIGH (ref 0.7–4.0)
Monocytes Relative: 8 % (ref 3–12)
Neutro Abs: 3.5 10*3/uL (ref 1.7–7.7)
Neutrophils Relative %: 39 % — ABNORMAL LOW (ref 43–77)

## 2010-09-09 LAB — ANAEROBIC CULTURE

## 2010-09-09 LAB — BODY FLUID CULTURE: Culture: NO GROWTH

## 2010-09-09 LAB — SURGICAL PCR SCREEN: Staphylococcus aureus: NEGATIVE

## 2010-09-09 LAB — APTT: aPTT: 25 seconds (ref 24–37)

## 2010-09-11 ENCOUNTER — Ambulatory Visit
Admission: RE | Admit: 2010-09-11 | Discharge: 2010-09-11 | Disposition: A | Discharge status: Home / Self Care | Payer: BC Managed Care – PPO | Source: Ambulatory Visit | Attending: Internal Medicine | Admitting: Internal Medicine

## 2010-09-11 DIAGNOSIS — Z1231 Encounter for screening mammogram for malignant neoplasm of breast: Secondary | ICD-10-CM

## 2010-09-16 LAB — COMPREHENSIVE METABOLIC PANEL
ALT: 19 U/L (ref 0–35)
AST: 21 U/L (ref 0–37)
CO2: 30 mEq/L (ref 19–32)
Calcium: 9.5 mg/dL (ref 8.4–10.5)
GFR calc Af Amer: >60 mL/min (ref >60)
Sodium: 138 mEq/L (ref 135–145)
Total Protein: 6.8 g/dL (ref 6.0–8.3)

## 2010-09-16 LAB — DIFFERENTIAL
Basophils Absolute: 0 10*3/uL (ref 0.0–0.1)
Basophils Relative: 0 % (ref 0–1)
Lymphocytes Relative: 42 % (ref 12–46)

## 2010-09-16 LAB — URINALYSIS, ROUTINE W REFLEX MICROSCOPIC
Hgb urine dipstick: NEGATIVE
Nitrite: NEGATIVE
Protein, ur: NEGATIVE mg/dL
Specific Gravity, Urine: 1.011 (ref 1.005–1.030)
Urobilinogen, UA: 0.2 mg/dL (ref 0.0–1.0)

## 2010-09-16 LAB — CBC
MCHC: 33.8 g/dL (ref 30.0–36.0)
RBC: 4.23 MIL/uL (ref 3.87–5.11)
RDW: 12.4 % (ref 11.5–15.5)

## 2010-09-16 LAB — LIPASE, BLOOD: Lipase: 41 U/L (ref 11–59)

## 2010-10-14 LAB — CBC
HCT: 23.6 % — ABNORMAL LOW (ref 36.0–46.0)
HCT: 30.6 % — ABNORMAL LOW (ref 36.0–46.0)
Hemoglobin: 10.2 g/dL — ABNORMAL LOW (ref 12.0–15.0)
MCHC: 34.6 g/dL (ref 30.0–36.0)
MCHC: 34.9 g/dL (ref 30.0–36.0)
MCHC: 34.9 g/dL (ref 30.0–36.0)
MCHC: 35.3 g/dL (ref 30.0–36.0)
MCV: 91.3 fL (ref 78.0–100.0)
MCV: 91.9 fL (ref 78.0–100.0)
Platelets: 146 10*3/uL — ABNORMAL LOW (ref 150–400)
Platelets: 146 10*3/uL — ABNORMAL LOW (ref 150–400)
Platelets: 186 10*3/uL (ref 150–400)
RBC: 4.4 MIL/uL (ref 3.87–5.11)
RBC: 3.21 MIL/uL — ABNORMAL LOW (ref 3.87–5.11)
RDW: 12.4 % (ref 11.5–15.5)
WBC: 6.7 10*3/uL (ref 4.0–10.5)
WBC: 8.9 10*3/uL (ref 4.0–10.5)

## 2010-10-14 LAB — URINALYSIS, ROUTINE W REFLEX MICROSCOPIC
Bilirubin Urine: NEGATIVE
Glucose, UA: NEGATIVE mg/dL
Ketones, ur: NEGATIVE mg/dL
Nitrite: NEGATIVE
Protein, ur: NEGATIVE mg/dL
Urobilinogen, UA: 0.2 mg/dL (ref 0.0–1.0)
pH: 5.5 (ref 5.0–8.0)

## 2010-10-14 LAB — PROTIME-INR
INR: 0.9 (ref 0.00–1.49)
INR: 2 — ABNORMAL HIGH (ref 0.00–1.49)
INR: 2 — ABNORMAL HIGH (ref 0.00–1.49)
INR: 2.3 — ABNORMAL HIGH (ref 0.00–1.49)
Prothrombin Time: 18.8 seconds — ABNORMAL HIGH (ref 11.6–15.2)
Prothrombin Time: 27 seconds — ABNORMAL HIGH (ref 11.6–15.2)

## 2010-10-14 LAB — BASIC METABOLIC PANEL
BUN: 18 mg/dL (ref 6–23)
CO2: 30 mEq/L (ref 19–32)
Calcium: 10 mg/dL (ref 8.4–10.5)
Chloride: 94 mEq/L — ABNORMAL LOW (ref 96–112)
Creatinine, Ser: 0.89 mg/dL (ref 0.4–1.2)
GFR calc Af Amer: >60 mL/min (ref >60)
GFR calc non Af Amer: >60 mL/min (ref >60)
Glucose, Bld: 144 mg/dL — ABNORMAL HIGH (ref 70–99)
Potassium: 3.9 mEq/L (ref 3.5–5.1)
Sodium: 138 mEq/L (ref 135–145)

## 2010-10-14 LAB — APTT: aPTT: 26 seconds (ref 24–37)

## 2010-10-14 LAB — DIFFERENTIAL
Basophils Absolute: 0 10*3/uL (ref 0.0–0.1)
Basophils Relative: 0 % (ref 0–1)
Lymphocytes Relative: 44 % (ref 12–46)
Monocytes Relative: 7 % (ref 3–12)
Neutro Abs: 4.1 10*3/uL (ref 1.7–7.7)
Neutrophils Relative %: 45 % (ref 43–77)

## 2010-10-14 LAB — CROSSMATCH
ABO/RH(D): A POS
Antibody Screen: NEGATIVE

## 2010-11-11 NOTE — Op Note (Signed)
NAME:  Maureen Burch, ZIPP NO.:  [id number]   MEDICAL RECORD NO.:  [id number]          PATIENT TYPE:  INP   LOCATION:  5001                         FACILITY:  [facility]   PHYSICIAN:  Feliberto Gottron. Turner Daniels, M.D.   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   PROCEDURE:  Right total hip arthroplasty using a DePuy 50-mm ASR cup, S-  ROM 18 x 13 x 160 x 42 stem, 18B small cone, NK+0, and 45-mm ultimate  ball.   SURGEON:  Feliberto Gottron. Turner Daniels, MD   FIRST ASSISTANT:  Shirl Harris, PA-C   ANESTHETIC:  General endotracheal.   ESTIMATED BLOOD LOSS:  400 mL   FLUID REPLACEMENT:  1800 mL of crystalloid.   DRAINS PLACED:  Foley catheter.   URINE OUTPUT:  300 mL.   INDICATIONS FOR PROCEDURE:  A 65 year old woman with bilateral early  protrusio of both hips with increasing pain, right greater than left,  failed conservative treatment with anti-inflammatory medicines,  exercises, attempts at weight loss, and use of a cane, desires elective  right total hip arthroplasty, and then possibly left total hip  arthroplasty after she gets over this.  Risks and benefits of surgery  have been discussed.  Questions have been answered.   DESCRIPTION OF PROCEDURE:  The patient was identified by armband and  received 2 g of Ancef IV in the preop holding area.  She was then taken  to operating room for where the appropriate anesthetic monitors were  attached, and general endotracheal anesthesia induced with the patient  in the supine position.  She was then rolled into the left lateral  decubitus position, and the right lower extremity was prepped and draped  in usual sterile fashion from the ankle to  hemipelvis after first  inserting a Foley catheter.  After she had been rolled into the left  lateral decubitus position, she was fixed there with a Stulberg Mark II  pelvic clamp, keeping her pelvis vertical, and then she was prepped and  draped.  Standard time-out procedure was then performed, skin along the  lateral hip and thigh was infiltrated with 20 mL of 0.5% Marcaine and  epinephrine solution, and we began the operation itself by making a 15-  cm incision centered over the greater trochanter through the skin and  subcutaneous tissue down to the level of the IT band, which was cut in  line with the skin incision exposing the greater trochanter.  Cobra  retractors were then placed between the quadratus femoris and the  inferior hip joint capsule and the gluteus minimus and the superior hip  joint capsule.  Short external rotators and piriformis were tagged with  a #2 Ethibond suture and cut off their insertion on the  intertrochanteric crest exposing the hip joint capsule, which was then  developed into an  acetabular based flap going from posterior-superior on  the rim of the acetabulum out over the base of the femoral neck and out  posterior-inferior along the acetabulum, and the flap was tagged with  two #2 Ethibond sutures.  Bleeders along the intertrochanteric crest  were cauterized.  The hip was flexed and internally rotated dislocating  the arthritic femoral head, which had a high varus configuration, and a  standard neck cut performed one fingerbreadth above the lesser  trochanter.  The proximal femur was then translated anteriorly levering  off the superior anterior column of Hohmann retractor.  A spiked Cobra  retractor was placed in the cotyloid notch inferiorly and finally a  posterior-inferior wing retractor was placed in the junction of the  ischium and acetabulum.  This allowed removal of the labrum, although  much of it was actually calcified from the protrusio  deformity, and had  to be removed with rongeurs as apposed to electrocautery.  We then  sequentially reamed the acetabulum up to a 49 basket reamer obtaining  good coverage in all quadrants just getting into the subchondral bone.  The edges were attached to the 50-mm basket reamer and then we selected  a 50-mm ASR cup after first irrigating out the acetabulum with normal  saline solution, the cup was inserted in 45 degrees of abduction and 50-  20 degrees of anteversion, and had an excellent press fit.  You could  pretty much lift the pelvis off the table with the inserter attached.  The hip was then flexed and internally rotated exposing the proximal  femur, which was entered with the round box cutting chisel followed by  the initiating reamer and followed by axial reaming up to a 13-mm axial  reamer, obtaining good chatter at 12.5 and 13 and then finally going to  13.5.  We went part way down with a 14-mm reamer and then reamed the  cone up to a 18B cone and milled the calcar to a B small calcar.  An 18B  small trial was then hammered into place to the appropriate depth for 42  base neck and a trial stem with a 42 base was inserted with the same  version as the calcar about 20 degrees and an NK +0, 45-mm trial.  The  hip was reduced, could not be dislocated in external rotation and  extension and in flexion, and internal rotation flexed to 90, internally  rotate to about 75 or 80 before any instability was noted.  At this  point, the trial components were removed and the wound was hand lavaged  with normal saline solution, and an 18 B small ZTT1 cone was hammered  into place.  We reamed one more time to 13.5 through the cone and then  inserted an 18 x 13 x 160 x 42 stem in the same version as the cone.  Once this was seated, an NK +0, 45-mm ultimate ball was hammered on the  stem.  The hip once again reduced and stability noted to be excellent.  The wound was hand lavaged one more time.   The capsular flap and short  external rotators were repaired back to the intertrochanteric crest  through drill holes.  The IT band was closed with a running #1 Vicryl  suture.  The subcutaneous tissue with 0 and 2-0 undyed Vicryl suture,  and the skin with skin staples.  A dressing of Xeroform and Mepilex was  then applied.  The patient was unclamped, rolled supine,  awakened, and  taken to the recovery room without difficulty.      Feliberto Gottron. Turner Daniels, M.D.  Electronically Signed     FJR/MEDQ  D:  08/13/2008  T:  08/13/2008  Job:  [id number]

## 2010-11-11 NOTE — Op Note (Signed)
NAME:  MAKENSEY, REGO NO.:  [id number]   MEDICAL RECORD NO.:  [id number]          PATIENT TYPE:  AMB   LOCATION:  DSC                          FACILITY:  [facility]   PHYSICIAN:  Currie Paris, M.D.DATE OF BIRTH:  June 15, 1946   DATE OF PROCEDURE:  08/18/2007  DATE OF DISCHARGE:                               OPERATIVE REPORT   PREOPERATIVE DIAGNOSIS:  Epidermoid cyst of back.   POSTOPERATIVE DIAGNOSIS:  Epidermoid cyst of back.   OPERATION:  Removal of epidermoid cyst of back.   SURGEON:  Currie Paris, M.D.   ANESTHESIA:  Local.   CLINICAL HISTORY:  This patient has a gradually enlarging cyst that she  wished to have removed.   DESCRIPTION OF PROCEDURE:  The patient was seen in the minor procedure  room and we confirmed the plans for the procedure.  The cyst itself was  identified and marked.   The patient was given local anesthesia using 1% Xylocaine with epi.  I  waited about 10 minutes for this to work and then prepped the area with  Betadine.  We had already done the time-out prior to local anesthesia  administration.   I made a transverse elliptical incision.  The epidermoid cyst was  excised in toto and intact.  There was no significant bleeding.   The incision was closed with 3-0 Vicryl, 4-0 Monocryl subcuticular and  Dermabond.   The patient tolerated the procedure well.      Currie Paris, M.D.  Electronically Signed     CJS/MEDQ  D:  08/18/2007  T:  08/19/2007  Job:  [id number]

## 2010-11-14 NOTE — Discharge Summary (Signed)
NAME:  Maureen Burch, Maureen Burch NO.:  [id number]   MEDICAL RECORD NO.:  [id number]          PATIENT TYPE:  INP   LOCATION:  5001                         FACILITY:  [facility]   PHYSICIAN:  Feliberto Gottron. Turner Daniels, M.D.   DATE OF BIRTH:  1946-05-19   DATE OF ADMISSION:  08/13/2008  DATE OF DISCHARGE:  08/18/2008                               DISCHARGE SUMMARY   CHIEF COMPLAINT:  Right hip pain.   HISTORY OF PRESENT ILLNESS:  This is a 65 year old lady who complains of  severe unremitting pain in her right hip despite conservative treatment  with antiinflammatories and intra-articular steroid injections.  She  desires a surgical intervention at this time.  All risks and benefits of  surgery were discussed with the patient.   Her past medical history is significant for:  1. Hypertension.  2. Hypothyroidism.   PAST SURGICAL HISTORY:  1. Hysterectomy.  2. Tonsillectomy.  3. Appendectomy.   SOCIAL HISTORY:  She denies use of alcohol or tobacco.   FAMILY HISTORY:  Noncontributory.   She has no known drug allergies.   CURRENT MEDICATIONS:  1. Premarin 0.3 mg 1 p.o. daily.  2. Synthroid 75 mcg 1 p.o. daily.  3. Lisinopril 20 mg 1 p.o. b.i.d.  4. Amitriptyline 25 mg 1 p.o. b.i.d.  5. Hydrochlorothiazide 25 mg 1 p.o. daily.  6. Atenolol 25 mg 1 p.o. b.i.d.  7. Alprazolam 0.25 mg 1 p.o. b.i.d.   PHYSICAL EXAMINATION:  EXTREMITIES:  Gross examination of the right hip  demonstrates the patient to have tenderness with internal rotation.  She  walks with an antalgic gait.  She is neurovascularly intact.   X-rays demonstrate bone-on-bone degenerative joint disease of the medial  pole of the right hip with varus angulation.   PREOPERATIVE LABORATORY DATA:  White blood cells 8.8, red blood cells  4.4, hemoglobin 13.8, hematocrit 39.9, platelets 215.  PT 12.5, INR is  0.9, PTT 26.  Sodium 138, potassium 4.4, chloride 101, glucose 97, BUN  20, creatinine 0.89.  Urinalysis was within  normal limits.   HOSPITAL COURSE:  Maureen Burch was admitted to [facility] on  August 13, 2008, when she underwent right total hip arthroplasty.  The  procedure was performed by Dr. Gean Birchwood, and the patient tolerated it  well.  A Foley catheter was placed perioperatively.  The patient was  transferred to the orthopedic floor and placed on Lovenox and Coumadin  for DVT prophylaxis.  On the first postoperative day, the patient was  awake and alert and complained of pain in her hip.  She had nausea but  no vomiting.  Hemoglobin was 10.7.  She was evaluated by Physical  Therapy but was limited due to nausea and hypotension.  On the second  postoperative day, the patient was awake and alert.  She reported  improvement in her nausea.  Her pain was well controlled.  Hemoglobin  was 9.5.  Surgical dressing was clean and dry.  Physical therapy on this  day was limited by patient's complaint of dizziness.  On the third  postoperative day, the patient reported that her  dizziness had improved.  Her blood pressure had increased to 123/66.  Hemoglobin was 9.2.  The  patient was transfused with 1 unit of blood because she was symptomatic.  On the fourth postoperative day, the patient was awake and alert.  She  denied any nausea or vomiting.  She reported improvement in her  dizziness and continued to make slow progress with physical therapy.  On  the fifth postoperative day, the patient was eating well and ambulating  independently.  Her surgical dressing was clean and dry, and she was  discharged home.   DISPOSITION:  The patient was discharged home on August 18, 2008.  She  was weightbearing as tolerated.  Her home health care will manage her  wound, Coumadin, and physical therapy.  She will return to the clinic to  see Dr. Turner Daniels in 10 days for removal of her sutures and x-rays.  Discharge medicines were as per the ER with the addition of Percocet 5  mg 1-2 tablets p.o. q.4 h. p.r.n.  pain and Coumadin to take as directed  with a target INR of 1.52.   FINAL DIAGNOSIS:  End-stage degenerative joint disease of the right hip.      Maureen Harris, PA      Feliberto Gottron. Turner Daniels, M.D.  Electronically Signed    JW/MEDQ  D:  09/24/2008  T:  09/25/2008  Job:  [id number]

## 2010-12-08 ENCOUNTER — Ambulatory Visit
Admission: RE | Admit: 2010-12-08 | Discharge: 2010-12-08 | Disposition: A | Discharge status: Home / Self Care | Payer: BC Managed Care – PPO | Source: Ambulatory Visit | Attending: Internal Medicine | Admitting: Internal Medicine

## 2010-12-08 ENCOUNTER — Other Ambulatory Visit: Payer: Self-pay | Admitting: Internal Medicine

## 2010-12-08 DIAGNOSIS — M7989 Other specified soft tissue disorders: Secondary | ICD-10-CM

## 2011-03-05 ENCOUNTER — Other Ambulatory Visit: Payer: Self-pay | Admitting: Diagnostic Neuroimaging

## 2011-03-05 DIAGNOSIS — I639 Cerebral infarction, unspecified: Secondary | ICD-10-CM

## 2011-03-19 ENCOUNTER — Ambulatory Visit
Admission: RE | Admit: 2011-03-19 | Discharge: 2011-03-19 | Disposition: A | Discharge status: Home / Self Care | Payer: BC Managed Care – PPO | Source: Ambulatory Visit | Attending: Diagnostic Neuroimaging | Admitting: Diagnostic Neuroimaging

## 2011-03-19 DIAGNOSIS — I639 Cerebral infarction, unspecified: Secondary | ICD-10-CM

## 2011-03-19 MED ORDER — GADOBENATE DIMEGLUMINE 529 MG/ML IV SOLN
17.0000 mL | Freq: Once | INTRAVENOUS | Status: AC | PRN
  Administered 2011-03-19: 17 mL via INTRAVENOUS

## 2011-04-03 IMAGING — CR DG CHEST 1V PORT
1 series · 1 of 1 positions shown · non-contrast
Comparison: Two-view chest x-ray 05/19/2010 and 08/07/2008.

CLINICAL DATA: Wheezing.  History of chronic bronchitis.  Recent
right total hip arthroplasty.

PORTABLE CHEST - 1 VIEW [DATE]/[id number] hours:

[AP]
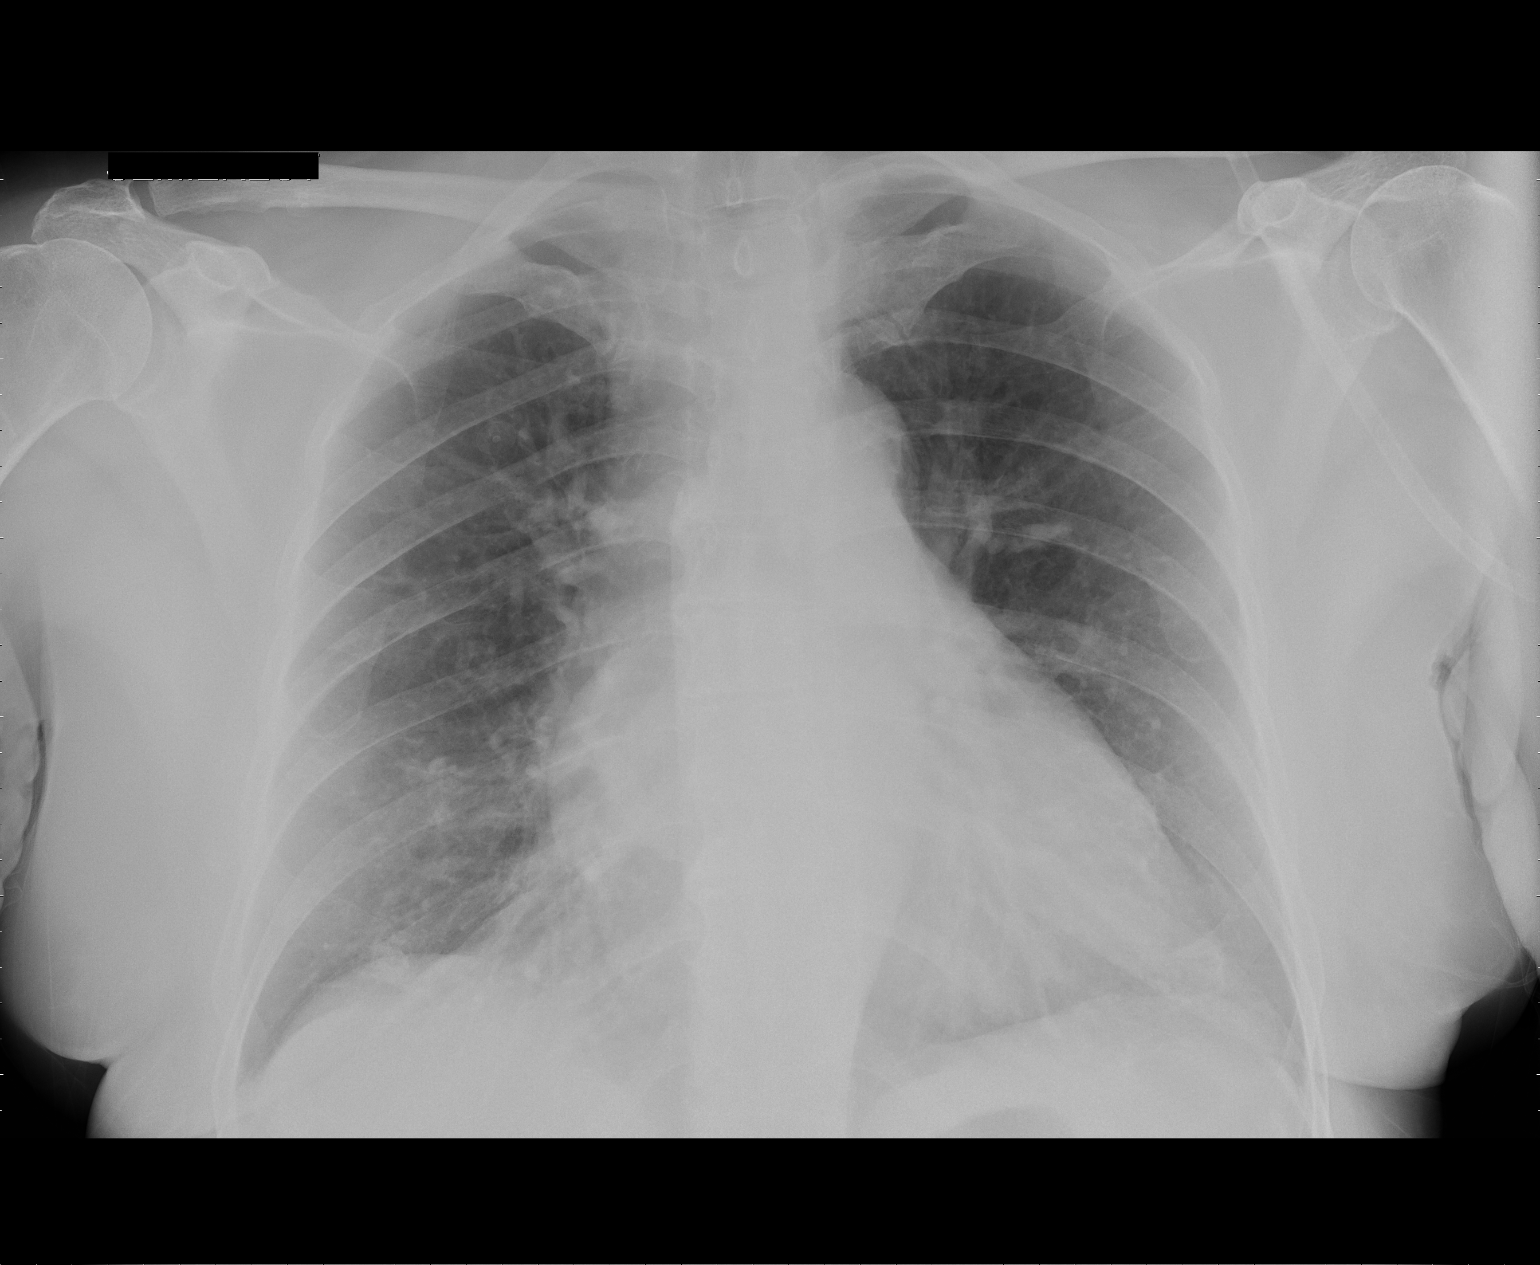

[1 of 1 positions shown; findings below may reference images not displayed]

FINDINGS: Cardiac silhouette mildly enlarged but stable, allowing
for differences in technique.  Pulmonary vascularity normal without
evidence of pulmonary edema.  Calcified granuloma in the right
lower lobe as noted on the prior CT.  Mildly prominent
bronchovascular markings diffusely, more so than on the [DATE]
examination.  No localized airspace consolidation.  No visible
pleural effusions.
IMPRESSION: Stable mild cardiomegaly without pulmonary edema.  Mild changes of
acute bronchitis and/or asthma without localized airspace
pneumonia.

## 2011-04-17 ENCOUNTER — Other Ambulatory Visit: Payer: Self-pay | Admitting: Internal Medicine

## 2011-04-17 DIAGNOSIS — R911 Solitary pulmonary nodule: Secondary | ICD-10-CM

## 2011-04-29 ENCOUNTER — Ambulatory Visit
Admission: RE | Admit: 2011-04-29 | Discharge: 2011-04-29 | Disposition: A | Discharge status: Home / Self Care | Payer: BC Managed Care – PPO | Source: Ambulatory Visit | Attending: Internal Medicine | Admitting: Internal Medicine

## 2011-04-29 DIAGNOSIS — R911 Solitary pulmonary nodule: Secondary | ICD-10-CM

## 2011-07-29 ENCOUNTER — Emergency Department (HOSPITAL_COMMUNITY): Payer: BC Managed Care – PPO

## 2011-07-29 ENCOUNTER — Inpatient Hospital Stay: Admission: RE | Admit: 2011-07-29 | Payer: BC Managed Care – PPO | Source: Ambulatory Visit

## 2011-07-29 ENCOUNTER — Encounter (HOSPITAL_COMMUNITY): Payer: Self-pay | Admitting: Internal Medicine

## 2011-07-29 ENCOUNTER — Other Ambulatory Visit: Payer: Self-pay | Admitting: Internal Medicine

## 2011-07-29 ENCOUNTER — Inpatient Hospital Stay (HOSPITAL_COMMUNITY)
Admission: EM | Admit: 2011-07-29 | Discharge: 2011-07-30 | DRG: 552 | Disposition: A | Discharge status: Home / Self Care | Payer: BC Managed Care – PPO | Attending: Internal Medicine | Admitting: Internal Medicine

## 2011-07-29 DIAGNOSIS — Z8673 Personal history of transient ischemic attack (TIA), and cerebral infarction without residual deficits: Secondary | ICD-10-CM

## 2011-07-29 DIAGNOSIS — K921 Melena: Secondary | ICD-10-CM | POA: Diagnosis present

## 2011-07-29 DIAGNOSIS — F419 Anxiety disorder, unspecified: Secondary | ICD-10-CM | POA: Diagnosis present

## 2011-07-29 DIAGNOSIS — Z87891 Personal history of nicotine dependence: Secondary | ICD-10-CM

## 2011-07-29 DIAGNOSIS — K529 Noninfective gastroenteritis and colitis, unspecified: Secondary | ICD-10-CM | POA: Diagnosis present

## 2011-07-29 DIAGNOSIS — E039 Hypothyroidism, unspecified: Secondary | ICD-10-CM | POA: Diagnosis present

## 2011-07-29 DIAGNOSIS — G4733 Obstructive sleep apnea (adult) (pediatric): Secondary | ICD-10-CM | POA: Diagnosis present

## 2011-07-29 DIAGNOSIS — I10 Essential (primary) hypertension: Secondary | ICD-10-CM | POA: Diagnosis present

## 2011-07-29 DIAGNOSIS — I639 Cerebral infarction, unspecified: Secondary | ICD-10-CM | POA: Insufficient documentation

## 2011-07-29 DIAGNOSIS — I498 Other specified cardiac arrhythmias: Secondary | ICD-10-CM | POA: Diagnosis present

## 2011-07-29 DIAGNOSIS — K5289 Other specified noninfective gastroenteritis and colitis: Secondary | ICD-10-CM | POA: Diagnosis present

## 2011-07-29 DIAGNOSIS — K559 Vascular disorder of intestine, unspecified: Principal | ICD-10-CM | POA: Diagnosis present

## 2011-07-29 DIAGNOSIS — N179 Acute kidney failure, unspecified: Secondary | ICD-10-CM | POA: Diagnosis present

## 2011-07-29 DIAGNOSIS — R001 Bradycardia, unspecified: Secondary | ICD-10-CM | POA: Diagnosis present

## 2011-07-29 DIAGNOSIS — F411 Generalized anxiety disorder: Secondary | ICD-10-CM | POA: Diagnosis present

## 2011-07-29 DIAGNOSIS — E785 Hyperlipidemia, unspecified: Secondary | ICD-10-CM | POA: Diagnosis present

## 2011-07-29 DIAGNOSIS — E860 Dehydration: Secondary | ICD-10-CM | POA: Diagnosis present

## 2011-07-29 HISTORY — DX: Anxiety disorder, unspecified: F41.9

## 2011-07-29 HISTORY — DX: Hypothyroidism, unspecified: E03.9

## 2011-07-29 HISTORY — DX: Essential (primary) hypertension: I10

## 2011-07-29 HISTORY — DX: Other complications of anesthesia, initial encounter: T88.59XA

## 2011-07-29 HISTORY — DX: Hyperlipidemia, unspecified: E78.5

## 2011-07-29 HISTORY — DX: Adverse effect of unspecified anesthetic, initial encounter: T41.45XA

## 2011-07-29 HISTORY — DX: Cerebral infarction, unspecified: I63.9

## 2011-07-29 HISTORY — DX: Obstructive sleep apnea (adult) (pediatric): G47.33

## 2011-07-29 LAB — DIFFERENTIAL
Basophils Absolute: 0 10*3/uL (ref 0.0–0.1)
Basophils Relative: 0 % (ref 0–1)
Eosinophils Absolute: 0.3 10*3/uL (ref 0.0–0.7)
Eosinophils Relative: 3 % (ref 0–5)
Lymphocytes Relative: 46 % (ref 12–46)
Neutro Abs: 3.9 10*3/uL (ref 1.7–7.7)
Neutrophils Relative %: 45 % (ref 43–77)

## 2011-07-29 LAB — T4, FREE: Free T4: 1.41 ng/dL (ref 0.80–1.80)

## 2011-07-29 LAB — CBC
HCT: 41.5 % (ref 36.0–46.0)
Hemoglobin: 14.5 g/dL (ref 12.0–15.0)
Hemoglobin: 12.8 g/dL (ref 12.0–15.0)
MCHC: 34.8 g/dL (ref 30.0–36.0)
Platelets: 214 10*3/uL (ref 150–400)
RBC: 4.76 MIL/uL (ref 3.87–5.11)
RBC: 4.21 MIL/uL (ref 3.87–5.11)
WBC: 8.7 10*3/uL (ref 4.0–10.5)

## 2011-07-29 LAB — COMPREHENSIVE METABOLIC PANEL
ALT: 25 U/L (ref 0–35)
AST: 26 U/L (ref 0–37)
Albumin: 4.2 g/dL (ref 3.5–5.2)
Calcium: 10.8 mg/dL — ABNORMAL HIGH (ref 8.4–10.5)
GFR calc Af Amer: 58 mL/min — ABNORMAL LOW (ref >90)
Glucose, Bld: 90 mg/dL (ref 70–99)
Sodium: 139 mEq/L (ref 135–145)
Total Protein: 8.3 g/dL (ref 6.0–8.3)

## 2011-07-29 LAB — CALCIUM, IONIZED: Calcium, Ion: 1.27 mmol/L (ref 1.12–1.32)

## 2011-07-29 LAB — PROTIME-INR
INR: 0.97 (ref 0.00–1.49)
Prothrombin Time: 13.1 seconds (ref 11.6–15.2)

## 2011-07-29 LAB — TSH: TSH: 1.934 u[IU]/mL (ref 0.350–4.500)

## 2011-07-29 MED ORDER — ONDANSETRON HCL 4 MG/2ML IJ SOLN: 4.0000 mg | Freq: Four times a day (QID) | INTRAMUSCULAR | Status: DC | PRN

## 2011-07-29 MED ORDER — SODIUM CHLORIDE 0.9 % IJ SOLN
3.0000 mL | Freq: Two times a day (BID) | INTRAMUSCULAR | Status: DC
  Administered 2011-07-29: 3 mL via INTRAVENOUS

## 2011-07-29 MED ORDER — METRONIDAZOLE IN NACL 5-0.79 MG/ML-% IV SOLN
500.0000 mg | Freq: Three times a day (TID) | INTRAVENOUS | Status: DC
  Administered 2011-07-30 (×2): 500 mg via INTRAVENOUS
  Filled 2011-07-29 (×6): qty 100

## 2011-07-29 MED ORDER — CIPROFLOXACIN IN D5W 400 MG/200ML IV SOLN
400.0000 mg | Freq: Two times a day (BID) | INTRAVENOUS | Status: DC
  Administered 2011-07-30: 400 mg via INTRAVENOUS
  Filled 2011-07-29 (×3): qty 200

## 2011-07-29 MED ORDER — SIMVASTATIN 10 MG PO TABS
10.0000 mg | ORAL_TABLET | Freq: Every day | ORAL | Status: DC
  Administered 2011-07-29: 10 mg via ORAL
  Filled 2011-07-29 (×3): qty 1

## 2011-07-29 MED ORDER — SODIUM CHLORIDE 0.9 % IV SOLN
INTRAVENOUS | Status: DC
  Administered 2011-07-29: 22:00:00 via INTRAVENOUS

## 2011-07-29 MED ORDER — ONDANSETRON HCL 4 MG PO TABS: 4.0000 mg | ORAL_TABLET | Freq: Four times a day (QID) | ORAL | Status: DC | PRN

## 2011-07-29 MED ORDER — CIPROFLOXACIN IN D5W 400 MG/200ML IV SOLN
400.0000 mg | Freq: Once | INTRAVENOUS | Status: AC
  Administered 2011-07-29: 400 mg via INTRAVENOUS
  Filled 2011-07-29: qty 200

## 2011-07-29 MED ORDER — ACETAMINOPHEN 650 MG RE SUPP: 650.0000 mg | Freq: Four times a day (QID) | RECTAL | Status: DC | PRN

## 2011-07-29 MED ORDER — ACETAMINOPHEN 325 MG PO TABS: 650.0000 mg | ORAL_TABLET | Freq: Four times a day (QID) | ORAL | Status: DC | PRN

## 2011-07-29 MED ORDER — OXYCODONE HCL 5 MG PO TABS
5.0000 mg | ORAL_TABLET | ORAL | Status: DC | PRN
  Filled 2011-07-29: qty 1

## 2011-07-29 MED ORDER — IOHEXOL 300 MG/ML  SOLN
100.0000 mL | Freq: Once | INTRAMUSCULAR | Status: AC | PRN
  Administered 2011-07-29: 100 mL via INTRAVENOUS

## 2011-07-29 MED ORDER — METRONIDAZOLE IN NACL 5-0.79 MG/ML-% IV SOLN
500.0000 mg | Freq: Once | INTRAVENOUS | Status: AC
  Administered 2011-07-29: 500 mg via INTRAVENOUS
  Filled 2011-07-29: qty 100

## 2011-07-29 MED ORDER — SODIUM CHLORIDE 0.9 % IV BOLUS (SEPSIS)
500.0000 mL | Freq: Once | INTRAVENOUS | Status: AC
  Administered 2011-07-29: 500 mL via INTRAVENOUS

## 2011-07-29 MED ORDER — ALPRAZOLAM 0.25 MG PO TABS
0.2500 mg | ORAL_TABLET | Freq: Three times a day (TID) | ORAL | Status: DC | PRN
  Administered 2011-07-30: 0.25 mg via ORAL
  Filled 2011-07-29: qty 1

## 2011-07-29 MED ORDER — LEVOTHYROXINE SODIUM 75 MCG PO TABS
75.0000 ug | ORAL_TABLET | Freq: Every day | ORAL | Status: DC
  Administered 2011-07-30: 75 ug via ORAL
  Filled 2011-07-29 (×2): qty 1

## 2011-07-29 NOTE — ED Provider Notes (Addendum)
History     CSN: [id number]  Arrival date & time 07/29/11  1111   First MD Initiated Contact with Patient 07/29/11 1121      Chief Complaint  Patient presents with  . Hypotension     HPI Patient presents after having significant number of bowel movements consisting of blood and blood clots..  This followed an episode of constipation.  Patient denies vomiting blood.  Patient states that her blood pressure at home is in the high 70s.  She felt weak and near syncopal.  Patient has past history of polyps otherwise unremarkable. No past medical history on file.  No past surgical history on file.  No family history on file.  History  Substance Use Topics  . Smoking status: Not on file  . Smokeless tobacco: Not on file  . Alcohol Use: Not on file    OB History    Grav Para Term Preterm Abortions TAB SAB Ect Mult Living                  Review of Systems Negative except as noted in history of present illness Allergies  Review of patient's allergies indicates no known allergies.  Home Medications   Current Outpatient Rx  Name Route Sig Dispense Refill  . ALPRAZOLAM 0.25 MG PO TABS Oral Take 0.25 mg by mouth 3 (three) times daily as needed. anxiety    . AMITRIPTYLINE HCL 50 MG PO TABS Oral Take 50 mg by mouth at bedtime.    . ASPIRIN EC 81 MG PO TBEC Oral Take 81 mg by mouth daily.    . ATENOLOL 25 MG PO TABS Oral Take 25 mg by mouth 3 (three) times daily.    Marland Kitchen HYDROCHLOROTHIAZIDE 25 MG PO TABS Oral Take 25 mg by mouth daily.    Marland Kitchen LEVOTHYROXINE SODIUM 75 MCG PO TABS Oral Take 75 mcg by mouth daily.    Marland Kitchen LISINOPRIL 20 MG PO TABS Oral Take 20 mg by mouth 2 (two) times daily.    Marland Kitchen SIMVASTATIN 10 MG PO TABS Oral Take 10 mg by mouth at bedtime.      BP 131/67  Pulse 66  Temp(Src) 97.8 F (36.6 C) (Oral)  Resp 16  Ht 5\' 5"  (1.651 m)  Wt 175 lb (79.379 kg)  BMI 29.12 kg/m2  SpO2 100%  Physical Exam  Nursing note and vitals reviewed. Constitutional: She is oriented to  person, place, and time. She appears well-developed and well-nourished. No distress.  HENT:  Head: Normocephalic and atraumatic.  Eyes: Pupils are equal, round, and reactive to light.  Neck: Normal range of motion.  Cardiovascular: Normal rate and intact distal pulses.   Pulmonary/Chest: No respiratory distress. She has no wheezes. She has no rales.  Abdominal: Normal appearance. She exhibits no distension. There is tenderness. There is no rebound and no guarding.  Musculoskeletal: Normal range of motion.  Neurological: She is alert and oriented to person, place, and time. No cranial nerve deficit.  Skin: Skin is warm and dry. No rash noted. No pallor.  Psychiatric: She has a normal mood and affect. Her behavior is normal.    ED Course  Procedures (including critical care time)  Labs Reviewed  COMPREHENSIVE METABOLIC PANEL - Abnormal; Notable for the following:    Creatinine, Ser 1.12 (*)    Calcium 10.8 (*)    Alkaline Phosphatase 131 (*)    GFR calc non Af Amer 50 (*)    GFR calc Af Amer 58 (*)  All other components within normal limits  CBC  DIFFERENTIAL  LACTIC ACID, PLASMA  PROTIME-INR   Ct Abdomen Pelvis W Contrast  07/29/2011  *RADIOLOGY REPORT*  Clinical Data: Lower abdominal pain.  Nausea and vomiting for 2 days.  CT ABDOMEN AND PELVIS WITH CONTRAST  Technique:  Multidetector CT imaging of the abdomen and pelvis was performed following the standard protocol during bolus administration of intravenous contrast.  Contrast: OMNIPAQUE IOHEXOL 300 MG/ML IV SOLN  Comparison: 11/02/2009; 04/29/2011  Findings: Clustered calcified nodules once again noted in the superior segment right lower lobe.  A linear hypodensity in the lateral segment left hepatic lobe on image 18 of series 2 is small and likely insignificant.  The spleen, pancreas, and adrenal glands appear normal.  The gallbladder and biliary system appear unremarkable.  No pathologic retroperitoneal or porta hepatis  adenopathy is identified.  Abnormal colonic wall thickening in the splenic flexure is accompanied by surrounding mild pericolic stranding.  The wall thickening extends into the descending colon and distal transverse colon, and there is a trace amount of free pelvic fluid.  No overtly dilated bowel noted.  Orally administered contrast extends to the descending colon.  A 2 mm left kidney lower pole nonobstructive calculus is present. No ureteral calculus is observed.  Lower pelvis is obscured by the patient's right hip implant.  The right inguinal node has a short axis diameter of 1.3 cm.  IMPRESSION:  1.  Colonic wall thickening with epicenter at the splenic flexure, and mild pericolic stranding.  The appearance favors colitis.  No definite masses identified, although if the patient has persistent heme positive stools then colonoscopy may be warranted to rule out underlying colorectal malignancy. 2.  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 3.  Left kidney lower pole nonobstructive nephrolithiasis.  Original Report Authenticated By: Dellia Cloud, M.D.     1. Ischemic colitis       MDM  The hospitalist plan to admit the patient and gastroenterology will consult.  Patient started on antibiotics.        Nelia Shi, MD 07/29/11 1543  Nelia Shi, MD 07/29/11 1610  Nelia Shi, MD 07/29/11 [phone]

## 2011-07-29 NOTE — ED Notes (Signed)
Pt up to bathroom some dizziness with assitance  x1  To void

## 2011-07-29 NOTE — ED Notes (Signed)
GI at bedside

## 2011-07-29 NOTE — Consult Note (Signed)
Reason for Consult: Pain bright red blood per rectum abnormal CAT scan Referring Physician: Dr. Alvy Bimler Maureen Burch is an 66 y.o. female.  HPI: Patient known to my partner with history of colon polyp but negative colonoscopy 2 years who has been having some chronic constipation and had increased pain yesterday and then after finally passing a bowel movement had some blood red blood. She presented to the emergency room and a CT showed left-sided colitis all compatible with probable ischemic colitis. Currently she is doing better in the emergency room and has no additional complaints. She says she had duodenal ulcers years ago as did her sister but nothing else runs in the family and Maureen believe she had a essentially normal endoscopy 2 years ago as well. She does not smoke or drink but does take a baby  aspirin every day Past Medical History  Diagnosis Date  . HTN (hypertension)   . CVA (cerebral infarction)   . Hyperlipidemia   . Hypothyroidism   . Anxiety   . OSA (obstructive sleep apnea)     Uses CPAP Q HS  . Complication of anesthesia     low blood pressure with major surgery    Past Surgical History  Procedure Date  . Abdominal hysterectomy   . Tonsillectomy   . Carpal tunnel release     x 2 bilateral  . Total hip arthroplasty   . Revision total hip arthroplasty   . Varicose vein surgery     Family History  Problem Relation Age of Onset  . Breast cancer Father   . Breast cancer Sister     Social History:  reports that she quit smoking about 2 years ago. Her smoking use included Cigarettes. She smoked .5 packs per day. She has never used smokeless tobacco. She reports that she does not drink alcohol. Her drug history not on file.  Allergies: No Known Allergies  Medications: Maureen have reviewed the patient's current medications.  Results for orders placed during the hospital encounter of 07/29/11 (from the past 48 hour(s))  CBC     Status: Normal   Collection Time   07/29/11  12:15 PM      Component Value Range Comment   WBC 8.7  4.0 - 10.5 (K/uL)    RBC 4.76  3.87 - 5.11 (MIL/uL)    Hemoglobin 14.5  12.0 - 15.0 (g/dL)    HCT 16.0  10.9 - 32.3 (%)    MCV 87.2  78.0 - 100.0 (fL)    MCH 30.5  26.0 - 34.0 (pg)    MCHC 34.9  30.0 - 36.0 (g/dL)    RDW 55.7  32.2 - 02.5 (%)    Platelets 227  150 - 400 (K/uL)   DIFFERENTIAL     Status: Normal   Collection Time   07/29/11 12:15 PM      Component Value Range Comment   Neutrophils Relative 45  43 - 77 (%)    Neutro Abs 3.9  1.7 - 7.7 (K/uL)    Lymphocytes Relative 46  12 - 46 (%)    Lymphs Abs 4.0  0.7 - 4.0 (K/uL)    Monocytes Relative 7  3 - 12 (%)    Monocytes Absolute 0.6  0.1 - 1.0 (K/uL)    Eosinophils Relative 3  0 - 5 (%)    Eosinophils Absolute 0.3  0.0 - 0.7 (K/uL)    Basophils Relative 0  0 - 1 (%)    Basophils Absolute 0.0  0.0 - 0.1 (K/uL)   COMPREHENSIVE METABOLIC PANEL     Status: Abnormal   Collection Time   07/29/11 12:15 PM      Component Value Range Comment   Sodium 139  135 - 145 (mEq/L)    Potassium 3.7  3.5 - 5.1 (mEq/L)    Chloride 99  96 - 112 (mEq/L)    CO2 27  19 - 32 (mEq/L)    Glucose, Bld 90  70 - 99 (mg/dL)    BUN 22  6 - 23 (mg/dL)    Creatinine, Ser 1.61 (*) 0.50 - 1.10 (mg/dL)    Calcium 09.6 (*) 8.4 - 10.5 (mg/dL)    Total Protein 8.3  6.0 - 8.3 (g/dL)    Albumin 4.2  3.5 - 5.2 (g/dL)    AST 26  0 - 37 (U/L)    ALT 25  0 - 35 (U/L)    Alkaline Phosphatase 131 (*) 39 - 117 (U/L)    Total Bilirubin 0.7  0.3 - 1.2 (mg/dL)    GFR calc non Af Amer 50 (*) >90 (mL/min)    GFR calc Af Amer 58 (*) >90 (mL/min)   LACTIC ACID, PLASMA     Status: Normal   Collection Time   07/29/11 12:15 PM      Component Value Range Comment   Lactic Acid, Venous 1.8  0.5 - 2.2 (mmol/L)   PROTIME-INR     Status: Normal   Collection Time   07/29/11 12:15 PM      Component Value Range Comment   Prothrombin Time 13.1  11.6 - 15.2 (seconds)    INR 0.97  0.00 - 1.49      Ct Abdomen Pelvis W  Contrast  07/29/2011  *RADIOLOGY REPORT*  Clinical Data: Lower abdominal pain.  Nausea and vomiting for 2 days.  CT ABDOMEN AND PELVIS WITH CONTRAST  Technique:  Multidetector CT imaging of the abdomen and pelvis was performed following the standard protocol during bolus administration of intravenous contrast.  Contrast: OMNIPAQUE IOHEXOL 300 MG/ML IV SOLN  Comparison: 11/02/2009; 04/29/2011  Findings: Clustered calcified nodules once again noted in the superior segment right lower lobe.  A linear hypodensity in the lateral segment left hepatic lobe on image 18 of series 2 is small and likely insignificant.  The spleen, pancreas, and adrenal glands appear normal.  The gallbladder and biliary system appear unremarkable.  No pathologic retroperitoneal or porta hepatis adenopathy is identified.  Abnormal colonic wall thickening in the splenic flexure is accompanied by surrounding mild pericolic stranding.  The wall thickening extends into the descending colon and distal transverse colon, and there is a trace amount of free pelvic fluid.  No overtly dilated bowel noted.  Orally administered contrast extends to the descending colon.  A 2 mm left kidney lower pole nonobstructive calculus is present. No ureteral calculus is observed.  Lower pelvis is obscured by the patient's right hip implant.  The right inguinal node has a short axis diameter of 1.3 cm.  IMPRESSION:  1.  Colonic wall thickening with epicenter at the splenic flexure, and mild pericolic stranding.  The appearance favors colitis.  No definite masses identified, although if the patient has persistent heme positive stools then colonoscopy may be warranted to rule out underlying colorectal malignancy. 2.  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 3.  Left kidney lower pole nonobstructive nephrolithiasis.  Original Report Authenticated By: Dellia Cloud, M.D.    ROS negative except above Blood pressure 132/79,  pulse 62, temperature 97.8 F (36.6  C), temperature source Oral, resp. rate 14, height 5\' 5"  (1.651 m), weight 79.379 kg (175 lb), SpO2 98.00%. Physical Exam vital signs stable afebrile no acute distress exam pertinent for her abdomen being soft nontender no pedal edema good peripheral pulses labs and CT reviewed  Assessment/Plan: Probable ischemic colitis Plan: Will allow clear liquid we could probably get by with holding antibiotics and she seems to be getting over this quickly and we could consider a flexible sigmoidoscopy if the diagnosis is in question or when better we can advance her diet and hold off on workup if this recurs. If he does recur she would need a CT or MRI angiogram after confirmation with a flexible sigmoidoscopy and biopsy. Either myself or one of my partners will see her tomorrow and please call if any question or problem Vaishali Baise E 07/29/2011, 5:50 PM

## 2011-07-29 NOTE — H&P (Signed)
Hospital Admission Note Date: 07/29/2011  Patient name: Maureen Burch Medical record number: [id number] Date of birth: 1946/01/07 Age: 66 y.o. Gender: female PCP: Pearson Grippe, MD, MD  Attending physician: Vassie Loll, MD Emergency Contact:  Vitaley Celia [phone] (cell); [phone] ext [phone] (Work) Code Status: Full  Chief Complaint: Low blood pressure, bloody stool.    History of Present Illness: Maureen Burch is an 66 y.o. female with a PMH of CVA and HTN on ASA and anti-hypertensive medication who presented to the ER with a complaint of low blood pressures.  The patient states that she took dulcolax on 07/27/11 for constipation, and on 07/28/11, she had approximately 8 bloody stools accompanied by intestinal cramping.  She ate some mashed potatoes yesterday, but did not eat anything else because of the cramping and melena.  She had nausea but no vomiting.  The patient has not had any further bloody stool, but took her blood pressure with a home sphygmomanometer, and became concerned when her blood pressure was in the 70's systolic.  She also reported feeling dizzy and tired.  She has not had any frank syncopal spells, but has had significant pre-syncope.  She called her PCP who advised her to come to the ER for further evaluation.  Past Medical History Past Medical History  Diagnosis Date  . HTN (hypertension)   . CVA (cerebral infarction)   . Hyperlipidemia   . Hypothyroidism   . Anxiety   . OSA (obstructive sleep apnea)     Uses CPAP Q HS    Past Surgical History Past Surgical History  Procedure Date  . Abdominal hysterectomy   . Tonsillectomy   . Carpal tunnel release     x 2 bilateral  . Total hip arthroplasty   . Revision total hip arthroplasty   . Varicose vein surgery     Meds: Prior to Admission medications   Medication Sig Start Date End Date Taking? Authorizing Provider  ALPRAZolam (XANAX) 0.25 MG tablet Take 0.25 mg by mouth 3 (three) times daily  as needed. anxiety   Yes Historical Provider, MD  amitriptyline (ELAVIL) 50 MG tablet Take 50 mg by mouth at bedtime.   Yes Historical Provider, MD  aspirin EC 81 MG tablet Take 81 mg by mouth daily.   Yes Historical Provider, MD  atenolol (TENORMIN) 25 MG tablet Take 25 mg by mouth 3 (three) times daily.   Yes Historical Provider, MD  hydrochlorothiazide (HYDRODIURIL) 25 MG tablet Take 25 mg by mouth daily.   Yes Historical Provider, MD  levothyroxine (SYNTHROID, LEVOTHROID) 75 MCG tablet Take 75 mcg by mouth daily.   Yes Historical Provider, MD  lisinopril (PRINIVIL,ZESTRIL) 20 MG tablet Take 20 mg by mouth 2 (two) times daily.   Yes Historical Provider, MD  simvastatin (ZOCOR) 10 MG tablet Take 10 mg by mouth at bedtime.   Yes Historical Provider, MD    Allergies: Review of patient's allergies indicates no known allergies.  Social History: History   Social History  . Marital Status: Married    Spouse Name: N/A    Number of Children: N/A  . Years of Education: N/A   Occupational History  . Retired   . 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    Social History Main Topics  . Smoking status: Former Smoker -- 0.5 packs/day    Types: Cigarettes    Quit date: 07/28/2009  . Smokeless tobacco: Never Used   Comment: Intermittant smoker x 40 years.  . Alcohol Use: No  .  Drug Use: Not on file  . Sexually Active: Not on file   Other Topics Concern  . Not on file   Social History Narrative   Married.  Lives with husband in [place].  Ambulates independently.    Family History:  Family History  Problem Relation Age of Onset  . Breast cancer Father   . Breast cancer Sister     Review of Systems: Constitutional: No fever or chills;  Appetite diminished; No weight loss.  HEENT: No blurry vision or diplopia, no pharyngitis or dysphagia CV: No chest pain or arrhythmia.  Resp: + mild SOB, no cough. GI: +nausea, no diarrhea, no melena, + hematochezia.  GU: No dysuria or hematuria.  MSK:  no myalgias/arthralgias.  Neuro:  No headache or focal neurological deficits.  Psych: No depression, + anxiety.  Endo: + thyroid disease or DM.  Skin: +areas of skin depigmentation.  Heme: No anemia or blood dyscrasia   Physical Exam: Blood pressure 132/79, pulse 62, temperature 97.8 F (36.6 C), temperature source Oral, resp. rate 14, height 5\' 5"  (1.651 m), weight 79.379 kg (175 lb), SpO2 98.00%. BP 132/79  Pulse 62  Temp(Src) 97.8 F (36.6 C) (Oral)  Resp 14  Ht 5\' 5"  (1.651 m)  Wt 79.379 kg (175 lb)  BMI 29.12 kg/m2  SpO2 98%  General Appearance:    Alert, cooperative, no distress, appears stated age  Head:    Normocephalic, without obvious abnormality, atraumatic  Eyes:    PERRL, conjunctiva/corneas clear, EOM's intact  Ears:    Normal external ear canals, both ears  Nose:   Nares normal, septum midline, mucosa normal, no drainage    or sinus tenderness  Throat:   Lips, mucosa, and tongue normal; dentures upper palate, gums normal  Neck:   Supple, symmetrical, trachea midline, no adenopathy;    thyroid:  no enlargement/tenderness/nodules; no carotid   bruit or JVD  Back:     Symmetric, no curvature, ROM normal, no CVA tenderness  Lungs:     Clear to auscultation bilaterally, respirations unlabored  Chest Wall:    No tenderness or deformity   Heart:    Bradycardic rate, normal rhythm, S1 and S2 normal, no murmur, rub   or gallop  Abdomen:     Soft, tender LLQ, bowel sounds active all four quadrants,    no masses, no organomegaly  Extremities:   Extremities normal, atraumatic, no cyanosis or edema  Pulses:   2+ and symmetric all extremities  Skin:   Skin color, texture, turgor normal, depigmentation in facial skin  Neurologic:   CNII-XII intact, normal strength   Lab results: Basic Metabolic Panel:  Lab 07/29/11 1610  NA 139  K 3.7  CL 99  CO2 27  GLUCOSE 90  BUN 22  CREATININE 1.12*  CALCIUM 10.8*  MG --  PHOS --   GFR Estimated Creatinine Clearance: 50.8  ml/min (by C-G formula based on Cr of 1.12). Liver Function Tests:  Lab 07/29/11 1215  AST 26  ALT 25  ALKPHOS 131*  BILITOT 0.7  PROT 8.3  ALBUMIN 4.2   Coagulation profile  Lab 07/29/11 1215  INR 0.97  PROTIME --    CBC:  Lab 07/29/11 1215  WBC 8.7  NEUTROABS 3.9  HGB 14.5  HCT 41.5  MCV 87.2  PLT 227    Imaging results:  Ct Abdomen Pelvis W Contrast  07/29/2011  *RADIOLOGY REPORT*  Clinical Data: Lower abdominal pain.  Nausea and vomiting for 2 days.  CT  ABDOMEN AND PELVIS WITH CONTRAST  Technique:  Multidetector CT imaging of the abdomen and pelvis was performed following the standard protocol during bolus administration of intravenous contrast.  Contrast: OMNIPAQUE IOHEXOL 300 MG/ML IV SOLN  Comparison: 11/02/2009; 04/29/2011  Findings: Clustered calcified nodules once again noted in the superior segment right lower lobe.  A linear hypodensity in the lateral segment left hepatic lobe on image 18 of series 2 is small and likely insignificant.  The spleen, pancreas, and adrenal glands appear normal.  The gallbladder and biliary system appear unremarkable.  No pathologic retroperitoneal or porta hepatis adenopathy is identified.  Abnormal colonic wall thickening in the splenic flexure is accompanied by surrounding mild pericolic stranding.  The wall thickening extends into the descending colon and distal transverse colon, and there is a trace amount of free pelvic fluid.  No overtly dilated bowel noted.  Orally administered contrast extends to the descending colon.  A 2 mm left kidney lower pole nonobstructive calculus is present. No ureteral calculus is observed.  Lower pelvis is obscured by the patient's right hip implant.  The right inguinal node has a short axis diameter of 1.3 cm.  IMPRESSION:  1.  Colonic wall thickening with epicenter at the splenic flexure, and mild pericolic stranding.  The appearance favors colitis.  No definite masses identified, although if the  patient has persistent heme positive stools then colonoscopy may be warranted to rule out underlying colorectal malignancy. 2.  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 3.  Left kidney lower pole nonobstructive nephrolithiasis.  Original Report Authenticated By: Dellia Cloud, M.D.    Assessment & Plan: Principal Problem:  *Colitis The patient will be admitted and placed on empiric Cipro and Flagyl. She has not recently had antibiotics, so I will defer on ordering C. difficile studies by PCR at this time. Gastroenterology consultation has already been requested. We'll place her on bowel rest with clear liquid diet for now. Active Problems:  Hematochezia Likely secondary to colitis. This appears to be self-limited with no further stools today. Her hemoglobin is stable. We will monitor her hemoglobin and hematocrit closely while in the hospital. We'll heme check stools.  HTN (hypertension) Patient has a recent history of hypotension but has been slightly hypertensive while in hospital. She has held her home medications as instructed by her PCP. We'll continue to hold her antihypertensives for now and use when necessary hydralazine if needed for blood pressure control.  Hyperlipidemia The patient normally takes statin therapy. We will continue this while in the hospital.  Hypothyroidism Patient takes Synthroid. We will continue her usual home dose and check her TSH and free T4 given her bradycardia.  Anxiety We'll provide the patient with when necessary Xanax for anxiety control.  OSA (obstructive sleep apnea) Patient has home BiPAP. We have asked her husband to bring in her home BiPAP machine and we will continue this at bedtime.  Bradycardia We will monitor her on telemetry and obtain a TSH and free T4 to ensure that her Synthroid is appropriately dosed and she is not under replaced.  ARF (acute renal failure) Likely pre-renal in etiology. Her baseline creatinine is 0.98. We'll gently  hydrate her and hold her ACE inhibitor.  Hypercalcemia We'll work this up further with an intact PTH and TSH studies. We'll also check an ionized calcium to ensure that this is accurate.  Prophylaxis: We will place the patient on PAS hoses given her recent hematochezia.  Time Spent On Admission: One hour  Jud Fanguy 07/29/2011, 4:19 PM Pager [phone]

## 2011-07-30 LAB — DIFFERENTIAL
Basophils Absolute: 0 10*3/uL (ref 0.0–0.1)
Eosinophils Relative: 4 % (ref 0–5)
Lymphocytes Relative: 49 % — ABNORMAL HIGH (ref 12–46)
Lymphs Abs: 4 10*3/uL (ref 0.7–4.0)
Neutro Abs: 3 10*3/uL (ref 1.7–7.7)

## 2011-07-30 LAB — BASIC METABOLIC PANEL
CO2: 27 mEq/L (ref 19–32)
Calcium: 9.3 mg/dL (ref 8.4–10.5)
Chloride: 105 mEq/L (ref 96–112)
Glucose, Bld: 93 mg/dL (ref 70–99)
Potassium: 3.6 mEq/L (ref 3.5–5.1)
Sodium: 140 mEq/L (ref 135–145)

## 2011-07-30 LAB — CBC
MCV: 87.4 fL (ref 78.0–100.0)
Platelets: 196 10*3/uL (ref 150–400)
RBC: 4.05 MIL/uL (ref 3.87–5.11)
WBC: 8.1 10*3/uL (ref 4.0–10.5)

## 2011-07-30 MED ORDER — TRAMADOL HCL 50 MG PO TABS: 50.0000 mg | ORAL_TABLET | Freq: Four times a day (QID) | ORAL | Status: AC | PRN

## 2011-07-30 MED ORDER — ATENOLOL 25 MG PO TABS: 25.0000 mg | ORAL_TABLET | Freq: Two times a day (BID) | ORAL | Status: DC

## 2011-07-30 MED ORDER — ASPIRIN EC 81 MG PO TBEC: 81.0000 mg | DELAYED_RELEASE_TABLET | Freq: Every day | ORAL | Status: DC

## 2011-07-30 MED ORDER — POLYETHYLENE GLYCOL 3350 17 GM/SCOOP PO POWD: 17.0000 g | Freq: Every day | ORAL | Status: AC

## 2011-07-30 NOTE — Progress Notes (Signed)
CARE MANAGEMENT NOTE 07/30/2011  Patient:  Maureen Burch, Maureen Burch   Account Number:  [id number]  Date Initiated:  07/30/2011  Documentation initiated by:  PEARSON,COOKIE  Subjective/Objective Assessment:   pt admitted with cco colitis, low BP, bloody stools     Action/Plan:   plan to dc home   Anticipated DC Date:  07/30/2011   Anticipated DC Plan:  HOME/SELF CARE      DC Planning Services  CM consult      PAC Choice  NA   Choice offered to / List presented to:     DME arranged  NA      DME agency  NA     HH arranged  NA      Status of service:  In process, will continue to follow Medicare Important Message given?  NA - LOS <3 / Initial given by admissions (If response is "NO", the following Medicare IM given date fields will be blank) Date Medicare IM given:   Date Additional Medicare IM given:    Discharge Disposition:  HOME/SELF CARE  Per UR Regulation:  Reviewed for med. necessity/level of care/duration of stay  Comments:  07/30/11 MPearson, RN, BSN Spoke with pt concerning home needs.  Pt states, her sister is home with her. There are no home health needs at present time.

## 2011-07-30 NOTE — Progress Notes (Signed)
Maureen Burch 2:05 PM  Subjective: The patient is doing better without any further bleeding although she still has a little bit of pain and she wants to go  Objective: Vital signs stable afebrile abdomen is soft very minimal periumbilical discomfort no guarding or rebound good bowel sounds labs stable  Assessment: Ischemic colitis improved  Plan: Okay with me to advance diet and if doing well home soon. We discussed chronic MiraLAX use to avoid constipation and either myself or my partner Dr. Evette Cristal who has seen her before happy to see back when necessary or in one to 2 week and please call us sooner if any further question or problem  [facility]

## 2011-07-30 NOTE — Discharge Summary (Signed)
Physician Discharge Summary  Patient ID: Maureen Burch MRN: [id number] DOB/AGE: 07-15-1945 66 y.o.  Admit date: 07/29/2011 Discharge date: 07/30/2011  Primary Care Physician:  Pearson Grippe, MD, MD   Discharge Diagnoses:   .Ischemic Colitis .Hematochezia .HTN (hypertension) .Hyperlipidemia .Hypothyroidism .Anxiety .OSA (obstructive sleep apnea) .Bradycardia .ARF (acute renal failure) .Hypercalcemia  Medication List  As of 07/30/2011  5:11 PM   TAKE these medications         ALPRAZolam 0.25 MG tablet   Commonly known as: XANAX   Take 0.25 mg by mouth 3 (three) times daily as needed. anxiety      amitriptyline 50 MG tablet   Commonly known as: ELAVIL   Take 50 mg by mouth at bedtime.      aspirin EC 81 MG tablet   Take 1 tablet (81 mg total) by mouth daily. Hold for 1 week; then restart same dose on daily basis.      atenolol 25 MG tablet   Commonly known as: TENORMIN   Take 1 tablet (25 mg total) by mouth 2 (two) times daily.      hydrochlorothiazide 25 MG tablet   Commonly known as: HYDRODIURIL   Take 25 mg by mouth daily.      levothyroxine 75 MCG tablet   Commonly known as: SYNTHROID, LEVOTHROID   Take 75 mcg by mouth daily.      lisinopril 20 MG tablet   Commonly known as: PRINIVIL,ZESTRIL   Take 20 mg by mouth 2 (two) times daily.      polyethylene glycol powder powder   Commonly known as: GLYCOLAX/MIRALAX   Take 17 g by mouth daily.      simvastatin 10 MG tablet   Commonly known as: ZOCOR   Take 10 mg by mouth at bedtime.      traMADol 50 MG tablet   Commonly known as: ULTRAM   Take 1 tablet (50 mg total) by mouth every 6 (six) hours as needed for pain.            Disposition and Follow-up:  Patient discharge in stable and improved condition; currently with just mild abdominal discomfort, no further bleeding and tolerating soft solid diet. After discussing with GI the recommendations were to d/c home on soft solid diet; miralax for chronic  constipation and follow up with them at the office in 1-2 weeks. Patient is in agreement with plan. Her vitals signs were now stable; electrolytes WNL and no further dizziness or lightheadedness. Patient will follow with PCP in 2 weeks for further evaluation and treatment of her chronic conditions.  Of note patient HR was in the 50's; atenolol has beend reduced to 25mg  BID; further dose adjustments per PCP.  Consults:   GI   Significant Diagnostic Studies:  Ct Abdomen Pelvis W Contrast  07/29/2011  *RADIOLOGY REPORT*  Clinical Data: Lower abdominal pain.  Nausea and vomiting for 2 days.  CT ABDOMEN AND PELVIS WITH CONTRAST  Technique:  Multidetector CT imaging of the abdomen and pelvis was performed following the standard protocol during bolus administration of intravenous contrast.  Contrast: OMNIPAQUE IOHEXOL 300 MG/ML IV SOLN  Comparison: 11/02/2009; 04/29/2011  Findings: Clustered calcified nodules once again noted in the superior segment right lower lobe.  A linear hypodensity in the lateral segment left hepatic lobe on image 18 of series 2 is small and likely insignificant.  The spleen, pancreas, and adrenal glands appear normal.  The gallbladder and biliary system appear unremarkable.  No pathologic retroperitoneal or  porta hepatis adenopathy is identified.  Abnormal colonic wall thickening in the splenic flexure is accompanied by surrounding mild pericolic stranding.  The wall thickening extends into the descending colon and distal transverse colon, and there is a trace amount of free pelvic fluid.  No overtly dilated bowel noted.  Orally administered contrast extends to the descending colon.  A 2 mm left kidney lower pole nonobstructive calculus is present. No ureteral calculus is observed.  Lower pelvis is obscured by the patient's right hip implant.  The right inguinal node has a short axis diameter of 1.3 cm.  IMPRESSION:  1.  Colonic wall thickening with epicenter at the splenic flexure,  and mild pericolic stranding.  The appearance favors colitis.  No definite masses identified, although if the patient has persistent heme positive stools then colonoscopy may be warranted to rule out underlying colorectal malignancy. 2.  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 3.  Left kidney lower pole nonobstructive nephrolithiasis.  Original Report Authenticated By: Dellia Cloud, M.D.    Brief H and P: Maureen Burch is an 66 y.o. female with a PMH of CVA and HTN on ASA and anti-hypertensive medication who presented to the ER with a complaint of low blood pressures. The patient states that she took dulcolax on 07/27/11 for constipation, and on 07/28/11, she had approximately 8 bloody stools accompanied by intestinal cramping. She ate some mashed potatoes yesterday, but did not eat anything else because of the cramping and melena. She had nausea but no vomiting. The patient has not had any further bloody stool, but took her blood pressure with a home sphygmomanometer, and became concerned when her blood pressure was in the 70's systolic. She also reported feeling dizzy and tired. She has not had any frank syncopal spells, but has had significant pre-syncope. She called her PCP who advised her to come to the ER for further evaluation  Hospital Course:  1-Colitis: most likely ischemic colitis; will d/c home and she will follow with GI as an outpatient in 1-2 weeks. Will start bowel regimen; and also recommend soft solid diet and good hydration.  2-Hematochezia: 2/2 to problem #1. Hgb stable. At discharge 12.0  3-HTN (hypertension): stable w/o meds but climbing; will resume home meds starting tomorrow 07/31/11.  4-Hyperlipidemia: continue statins.   5-Hypothyroidism: continue synthroid; TSH and free T4 WNL.   6-Anxiety: continue xanax as needed.   7-OSA (obstructive sleep apnea): continue BIPAP at bedtime.   8-ARF (acute renal failure): pre-renal in nature due to dehydration, nephrotoxic  agents and transient hypotension; Now resolved.   9-Hypercalcemia: 2/2 dehydration and HCTZ use; ionized calcium was normal. PTH pending at discharge but doubt would be abnormal. PCP can follow final results.    Time spent on Discharge: 40 minutes  Signed: Kieara Schwark 07/30/2011, 5:11 PM

## 2011-07-30 NOTE — Progress Notes (Signed)
Subjective: Still with mild cramping pain in her abdomen; no N/V and no dizziness. No fever.  Objective: Vital signs in last 24 hours: Temp:  [97.5 F (36.4 C)-97.9 F (36.6 C)] 97.7 F (36.5 C) (01/31 0635) Pulse Rate:  [56-66] 59  (01/31 0635) Resp:  [14-18] 18  (01/31 0635) BP: (103-137)/(65-91) 128/76 mmHg (01/31 0635) SpO2:  [95 %-100 %] 98 % (01/31 0635) Weight:  [79.379 kg (175 lb)-83.008 kg (183 lb)] 83.008 kg (183 lb) (01/30 2155) Weight change:  Last BM Date: 07/28/11  Intake/Output from previous day: 01/30 0701 - 01/31 0700 In: 732.5 [I.V.:432.5; IV Piggyback:300] Out: 350 [Urine:350] Total I/O In: -  Out: 400 [Urine:400]   Physical Exam: General: Alert, awake, oriented x3, in no significantdistress. HEENT: No bruits, no goiter. Heart: Regular rate and rhythm, without murmurs, rubs, gallops. Lungs: Clear to auscultation bilaterally. Abdomen: Soft, tender to deep palpation, nondistended, positive bowel sounds. Extremities: No clubbing, cyanosis or edema; with positive pedal pulses. Neuro: Nonfocal.   Lab Results: Basic Metabolic Panel:  Basename 07/30/11 0530 07/29/11 1215  NA 140 139  K 3.6 3.7  CL 105 99  CO2 27 27  GLUCOSE 93 90  BUN 16 22  CREATININE 0.95 1.12*  CALCIUM 9.3 10.8*  MG -- --  PHOS -- --   Liver Function Tests:  Basename 07/29/11 1215  AST 26  ALT 25  ALKPHOS 131*  BILITOT 0.7  PROT 8.3  ALBUMIN 4.2   CBC:  Basename 07/30/11 0530 07/29/11 2244 07/29/11 1215  WBC 8.1 10.3 --  NEUTROABS 3.0 -- 3.9  HGB 12.0 12.8 --  HCT 35.4* 36.8 --  MCV 87.4 87.4 --  PLT 196 214 --   Thyroid Function Tests:  Basename 07/29/11 1743  TSH 1.934  T4TOTAL --  FREET4 1.41  T3FREE --  THYROIDAB --   Coagulation:  Basename 07/29/11 1215  LABPROT 13.1  INR 0.97    Studies/Results: Ct Abdomen Pelvis W Contrast  07/29/2011  *RADIOLOGY REPORT*  Clinical Data: Lower abdominal pain.  Nausea and vomiting for 2 days.  CT ABDOMEN AND  PELVIS WITH CONTRAST  Technique:  Multidetector CT imaging of the abdomen and pelvis was performed following the standard protocol during bolus administration of intravenous contrast.  Contrast: OMNIPAQUE IOHEXOL 300 MG/ML IV SOLN  Comparison: 11/02/2009; 04/29/2011  Findings: Clustered calcified nodules once again noted in the superior segment right lower lobe.  A linear hypodensity in the lateral segment left hepatic lobe on image 18 of series 2 is small and likely insignificant.  The spleen, pancreas, and adrenal glands appear normal.  The gallbladder and biliary system appear unremarkable.  No pathologic retroperitoneal or porta hepatis adenopathy is identified.  Abnormal colonic wall thickening in the splenic flexure is accompanied by surrounding mild pericolic stranding.  The wall thickening extends into the descending colon and distal transverse colon, and there is a trace amount of free pelvic fluid.  No overtly dilated bowel noted.  Orally administered contrast extends to the descending colon.  A 2 mm left kidney lower pole nonobstructive calculus is present. No ureteral calculus is observed.  Lower pelvis is obscured by the patient's right hip implant.  The right inguinal node has a short axis diameter of 1.3 cm.  IMPRESSION:  1.  Colonic wall thickening with epicenter at the splenic flexure, and mild pericolic stranding.  The appearance favors colitis.  No definite masses identified, although if the patient has persistent heme positive stools then colonoscopy may be warranted  to rule out underlying colorectal malignancy. 2.  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 3.  Left kidney lower pole nonobstructive nephrolithiasis.  Original Report Authenticated By: Dellia Cloud, M.D.    Medications: Scheduled Meds:   . ciprofloxacin  400 mg Intravenous Once  . levothyroxine  75 mcg Oral QAC breakfast  . metronidazole  500 mg Intravenous Once  . simvastatin  10 mg Oral QHS  . sodium chloride   500 mL Intravenous Once  . sodium chloride  3 mL Intravenous Q12H  . DISCONTD: ciprofloxacin  400 mg Intravenous Q12H  . DISCONTD: metronidazole  500 mg Intravenous Q8H   Continuous Infusions:   . sodium chloride 50 mL/hr at 07/29/11 2205   PRN Meds:.acetaminophen, acetaminophen, ALPRAZolam, iohexol, ondansetron (ZOFRAN) IV, ondansetron, oxyCODONE  Assessment/Plan: 1-Colitis: most likely ischemic colitis; will d/c antibiotics; advance diet to soft solids and follow her symptoms. Per GI (Dr. Ewing Schlein) if she tolerates diet she can follow at the office for further work up. Will start bowel regimen.   2-Hematochezia: 2/2 to problem #1. Hgb stable  3-HTN (hypertension): stable w/o meds; will continue holding antihypertensives agents for now.  4-Hyperlipidemia: continue statins.  5-Hypothyroidism: continue synthroid; TSH and free T4 WNL.  6-Anxiety: continue xanax as needed.  7-OSA (obstructive sleep apnea): continue BIPAP at bedtime.  8-ARF (acute renal failure): resolved.  9-Hypercalcemia: 2/2 dehydration and HCTZ use; ionized calcium was normal.  Dispo: probably home this PM.    LOS: 1 day   Asad Keeven [facility] [phone]  07/30/2011, 10:17 AM

## 2011-08-03 ENCOUNTER — Other Ambulatory Visit: Payer: Self-pay | Admitting: Internal Medicine

## 2011-08-03 DIAGNOSIS — Z1231 Encounter for screening mammogram for malignant neoplasm of breast: Secondary | ICD-10-CM

## 2011-12-04 ENCOUNTER — Ambulatory Visit
Admission: RE | Admit: 2011-12-04 | Discharge: 2011-12-04 | Disposition: A | Discharge status: Home / Self Care | Payer: BC Managed Care – PPO | Source: Ambulatory Visit | Attending: Internal Medicine | Admitting: Internal Medicine

## 2011-12-04 DIAGNOSIS — Z1231 Encounter for screening mammogram for malignant neoplasm of breast: Secondary | ICD-10-CM

## 2012-10-21 ENCOUNTER — Other Ambulatory Visit: Payer: Self-pay | Admitting: Gastroenterology

## 2012-10-28 ENCOUNTER — Telehealth: Payer: Self-pay | Admitting: Hematology & Oncology

## 2012-10-28 NOTE — Telephone Encounter (Signed)
Pt aware of 5-13 appointment

## 2012-10-28 NOTE — Telephone Encounter (Signed)
Left pt message to call and schedule appointment °

## 2012-11-08 ENCOUNTER — Other Ambulatory Visit (HOSPITAL_BASED_OUTPATIENT_CLINIC_OR_DEPARTMENT_OTHER): Payer: BC Managed Care – PPO | Admitting: Lab

## 2012-11-08 ENCOUNTER — Ambulatory Visit (HOSPITAL_BASED_OUTPATIENT_CLINIC_OR_DEPARTMENT_OTHER): Payer: BC Managed Care – PPO | Admitting: Hematology & Oncology

## 2012-11-08 ENCOUNTER — Other Ambulatory Visit (HOSPITAL_COMMUNITY)
Admission: RE | Admit: 2012-11-08 | Discharge: 2012-11-08 | Disposition: A | Discharge status: Home / Self Care | Payer: BC Managed Care – PPO | Source: Ambulatory Visit | Attending: Hematology & Oncology | Admitting: Hematology & Oncology

## 2012-11-08 ENCOUNTER — Ambulatory Visit (HOSPITAL_BASED_OUTPATIENT_CLINIC_OR_DEPARTMENT_OTHER)
Admission: RE | Admit: 2012-11-08 | Discharge: 2012-11-08 | Disposition: A | Discharge status: Home / Self Care | Payer: BC Managed Care – PPO | Source: Ambulatory Visit | Attending: Hematology & Oncology | Admitting: Hematology & Oncology

## 2012-11-08 ENCOUNTER — Ambulatory Visit: Payer: BC Managed Care – PPO

## 2012-11-08 VITALS — BP 119/67 | HR 61 | Temp 97.9°F | Resp 16 | Ht 65.0 in | Wt 190.0 lb

## 2012-11-08 DIAGNOSIS — D72820 Lymphocytosis (symptomatic): Secondary | ICD-10-CM

## 2012-11-08 DIAGNOSIS — D649 Anemia, unspecified: Secondary | ICD-10-CM | POA: Insufficient documentation

## 2012-11-08 DIAGNOSIS — R161 Splenomegaly, not elsewhere classified: Secondary | ICD-10-CM

## 2012-11-08 DIAGNOSIS — L80 Vitiligo: Secondary | ICD-10-CM

## 2012-11-08 LAB — CBC WITH DIFFERENTIAL (CANCER CENTER ONLY)
BASO#: 0 10*3/uL (ref 0.0–0.2)
Eosinophils Absolute: 0.5 10*3/uL (ref 0.0–0.5)
HCT: 38.7 % (ref 34.8–46.6)
HGB: 13.1 g/dL (ref 11.6–15.9)
LYMPH%: 52.3 % — ABNORMAL HIGH (ref 14.0–48.0)
MCH: 30.4 pg (ref 26.0–34.0)
MCV: 90 fL (ref 81–101)
MONO#: 0.6 10*3/uL (ref 0.1–0.9)
MONO%: 7.6 % (ref 0.0–13.0)
NEUT%: 33.6 % — ABNORMAL LOW (ref 39.6–80.0)
RBC: 4.31 10*6/uL (ref 3.70–5.32)
WBC: 7.3 10*3/uL (ref 3.9–10.0)

## 2012-11-08 LAB — LACTATE DEHYDROGENASE: LDH: 146 U/L (ref 94–250)

## 2012-11-08 LAB — CHCC SATELLITE - SMEAR

## 2012-11-08 NOTE — Progress Notes (Signed)
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  HISTORY OF PRESENT ILLNESS:  Maureen Burch is a very charming 67 year old [place] female.  She certainly looks a lot younger.  She has been in the [place] for over 20 years.  She is from [place].  There has been no evidence of exposure to [place] Therapist, occupational.  She is followed by Dr. Selena Burch.  She was found to have some lymphocytosis. She was kindly referred over to the [facility] for an evaluation.  She has had no fevers, sweats or chills.  She has not noticed any palpable lymph glands.  She has had no abdominal pain.  There has been no early satiety.  There is no change in bowel or bladder habits.  Back in October 2013 her white cell count was 7.9.  Her white cell differential showed 40 segs, 46 lymphs.  She was not anemic.  On 10/20/2012, her white count 7.8, hemoglobin 12.8, hematocrit 38.3, platelet count was 206.  White cell differential showed 31 segs, 53 lymphs.  She had a relatively normal metabolic panel.  Again, she is kindly referred over to the [facility] for an evaluation of the lymphocytosis.  She is on quite a few medications.  She has had a right hip replacement with a revision.  She had a hysterectomy. She does have vitiligo.  Again, we were asked to see her for the lymphocytosis.  PAST MEDICAL HISTORY: 1. Vitiligo. 2. Right hip repair with subsequent revision. 3. Hysterectomy for fibroids. 4. Hypothyroidism. 5. Hyperlipidemia. 6. CVA. 7. Left carpal tunnel release.  ALLERGIES:  None.  MEDICATIONS: 1. Xanax 0.25 mg p.o. b.i.d. 2. Elavil 50 mg p.o. q.h.s. 3. Norvasc 2.5 mg p.o. daily. 4. Aspirin 81 mg p.o. daily. 5. Tenormin 25 mg p.o. t.i.d. 6. Hydrochlorothiazide 25 mg p.o. daily. 7. Synthroid 0.075 mg p.o. daily. 8. Lisinopril 20 mg p.o. b.i.d. 9. Oxy IR 5 mg p.o. q.6 hours p.r.n. 10.Zocor 10 mg p.o.  daily.  SOCIAL HISTORY:  Remarkable for remote tobacco use.  There is no alcohol use.  There are no obvious occupational exposures.  FAMILY HISTORY:  Relatively noncontributory.  Father actually had breast cancer and died at 69 years old.  REVIEW OF SYSTEMS:  As stated in history of present illness.  No additional findings noted on a 12 system review.  PHYSICAL EXAMINATION:  General:  This is a well-developed, well- nourished white female in no obvious distress.  Vital signs:  Show a temperature of 97.9, pulse 61, respiratory rate 16, blood pressure 119/67.  Weight is 190.  Head and neck:  Shows a normocephalic, atraumatic skull.  There are no ocular or oral lesions.  There are no palpable cervical or supraclavicular lymph nodes.  Lungs:  Clear bilaterally.  Cardiac:  Regular rate and rhythm with a normal S1, S2. There are no murmurs, rubs or bruits.  Abdomen:  Soft with good bowel sounds.  There is no palpable abdominal mass.  There is no fluid wave. There is no palpable hepatomegaly.  Spleen tip is palpable with deep inspiration.  Extremities:  Show no clubbing, cyanosis or edema. Axillary:  Shows no bilateral axillary adenopathy.  Extremities:  Show no clubbing, cyanosis or edema.  Skin:  Does show the vitiligo.  LABORATORY STUDIES:  White cell count is 7.3, hemoglobin 13.1, hematocrit 38.7, platelet count is 213.  White cell differential shows 34 segs, 52 lymphs.  Peripheral smear showed  a normochromic, normocytic population of red blood cells.  There are no nucleated red blood cells.  There are no target cells.  I see no schistocytes.  There are no spherocytes.  There is no rouleaux formation.  White cells appear normal in morphology and maturation.  There is an increase in lymphocytes.  She has a few smudge cells.  There are no hypersegmented polys.  I see no blasts.  There are no immature myeloid cells.  Platelets are adequate in number and size.  IMPRESSION:  [name] is a charming 67 year old [place] female. Again, she looks quite a lot younger.  She does have vitiligo.  She does have minimal lymphocytosis.  Chronic lymphocytic leukemia is a possibility.  She does have an enlarged spleen, at least by physical exam.  We did get an ultrasound of her abdomen today.  The results are pending.  I think the key clearly is going to be the flow cytometry.  We will see if she has a monoclonal population of white cells.  If she does have chronic lymphocytic leukemia, there is absolutely no indication for treatment.  I would not even bother with a bone marrow biopsy on her.  We will let her know what the results are of her labs.  I think that if her flow cytometry is negative, I would not believe we would have to see her back.  Even if she does have mild splenomegaly, I would not consider this as a clinical problem and I would just have this followed by her primary physician.  I spent a good hour or so with Maureen Burch and her husband.  They are both very, very nice.  She will be heading up to [facility] in 2 days for a month.  She will be with her son up there.  I did give her a prayer blanket.  She was very appreciative of this.    ______________________________ Josph Macho, M.D. PRE/MEDQ  D:  11/08/2012  T:  11/08/2012  Job:  [id number]  ADDENDUM:  Flow cytometry is negative for a monoclonal population of cells. Her ultrasound was normal with no splenomegaly or lymphadenopathy.   We do not need to see her back. 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 I spoke to her myself and she was very relieved to hear this.

## 2012-11-08 NOTE — Patient Instructions (Addendum)
It was nice to meet you today.   Next step is to wait for lab results, specifically flow cytometry. This will confirm whether or not you have CLL. This normally takes 7-10 days.  You will be called with the results of your ultrasound.    Complete Blood Count  A complete blood count is a group of tests that measure several characteristics of the three types of cells in your blood. The liquid portion of your blood (plasma) is not used in these tests. Irregularities found in results from these tests can indicate different conditions, such as anemia, infections, bleeding problems, and cancers. The blood tests included in a complete blood count can be broken down into the cell types that they examine and what they measure:   White blood cells.  White blood cell count This is a measurement of the number of white blood cells in a standard volume (concentration) in your blood sample.  White blood cell differential This identifies the types of white blood cells and the concentration of each in the sample of your blood. There are five different types of white blood cells. They all help you fight infection but in different ways. The differential also identifies immature white blood cells.  Red blood cells.  Red blood cell count This is a measurement of the concentration of red blood cells in your blood sample.  Hemoglobin This is a measurement of the amount of hemoglobin in the sample of your blood. This measurement indicates your blood's overall oxygen-carrying capacity.  Hematocrit This is a measurement of the percentage of space that the red blood cells take up in your blood sample.  Mean corpuscular volume This is a measurement of the average size of your red blood cells.  Mean corpuscular hemoglobin This is a measurement of the average amount of hemoglobin inside each of your red blood cells.  Mean corpuscular hemoglobin concentration This is a calculation of the average concentration of  hemoglobin inside each of your red blood cells in your blood sample.  Red blood cell distribution width This is a measurement of the variation in the size of your red blood cells.  Platelets.  The platelet count This is a measurement of the concentration of platelets in your blood sample.  Mean platelet volume This is a measurement of the average size of the platelets in your blood sample. RESULTS It is your responsibility to obtain your test results. Ask the laboratory or department performing the test when and how you will get your results. Contact your caregiver to discuss any questions you have about your results. Results outside of normal ranges can be an indication of an illness. Examples of abnormal results and possible causes are listed as follows:   White blood cells.  An abnormally low concentration of white blood cells can be caused by certain infections and by conditions that interfere with white blood cell production that happens in the inner part of your bone (bone marrow).  An abnormally high concentration of white blood cells often indicates infections and conditions that cause inflammation. It can also be an indication of blood-related cancer.  Immature white blood cells can indicate an infection or an abnormal condition affecting your bone marrow.  Red blood cells.  An abnormally low concentration of red blood cells, hemoglobin, or hematocrit is called anemia.  An abnormally high concentration of red blood cells, hemoglobin, or hematocrit is called polycythemia. Abnormally high levels of red blood cells can indicate mild thalassemia. Thalassemia is a type  of anemia that is passed down through families (hereditary).  When your mean corpuscular volume is abnormally low, your red blood cells are smaller than normal. This can be caused by thalassemia or iron deficiency anemia. Iron deficiency anemia is a type of anemia that is the result of a deficiency of a nutrient  (deficiency anemia). In this case, the nutrient is iron.  An abnormally high mean corpuscular volume means your red blood cells are larger than normal. This can indicate a deficiency anemia caused by a lack of vitamin B12. An abnormally high mean corpuscular volume also can be caused by a lot of new red blood cells in your blood. This can happen after you have suddenly lost a lot of blood.  Abnormally low mean corpuscular hemoglobin concentration can indicate conditions in which your hemoglobin is abnormally diluted inside the red cells. Examples of these conditions are iron deficiency anemia and thalassemia.  An abnormally high mean corpuscular hemoglobin concentration can indicate the presence of certain hemolytic anemias. Hemolytic anemia is anemia that results from the abnormal breakdown of your red blood cells.  Red cell distribution width is abnormally increased in certain anemias, when new red blood cells are produced after acute blood loss, and with severe thalassemia.  Platelets  An abnormally low concentration of platelets can be a sign of a bleeding disorder.  An abnormally high concentration of platelets can occur with iron deficiency anemia, inflammatory disorders, and cancers or result from physical stresses, such as exercise or blood loss. However, it may also be a sign of a clotting disorder.  New platelets are larger than old platelets. An abnormally high mean platelet volume occurs with a large increase in the number of new platelets being produced by your bone marrow. This can happen after the loss of a large amount of blood or the destruction of your platelets by antibodies. An abnormally high mean platelet volume also can occur with certain bone marrow cancers. Document Released: 07/18/2004 Document Revised: 12/15/2011 Document Reviewed: 10/28/2011 [facility] Patient Information 2013 [place], [place].

## 2012-11-08 NOTE — Progress Notes (Signed)
This office note has been dictated.

## 2012-11-14 LAB — FLOW CYTOMETRY - CHCC SATELLITE

## 2013-12-26 ENCOUNTER — Other Ambulatory Visit: Payer: Self-pay

## 2013-12-26 DIAGNOSIS — Z1231 Encounter for screening mammogram for malignant neoplasm of breast: Secondary | ICD-10-CM

## 2014-01-03 ENCOUNTER — Ambulatory Visit: Payer: BC Managed Care – PPO

## 2014-01-04 ENCOUNTER — Ambulatory Visit
Admission: RE | Admit: 2014-01-04 | Discharge: 2014-01-04 | Disposition: A | Discharge status: Home / Self Care | Payer: BC Managed Care – PPO | Source: Ambulatory Visit

## 2014-01-04 DIAGNOSIS — Z1231 Encounter for screening mammogram for malignant neoplasm of breast: Secondary | ICD-10-CM

## 2014-06-27 ENCOUNTER — Encounter: Payer: Self-pay | Admitting: Neurology

## 2014-06-27 ENCOUNTER — Ambulatory Visit (INDEPENDENT_AMBULATORY_CARE_PROVIDER_SITE_OTHER): Payer: BC Managed Care – PPO | Admitting: Neurology

## 2014-06-27 VITALS — BP 90/61 | HR 63 | Resp 14 | Ht 65.5 in | Wt 164.0 lb

## 2014-06-27 DIAGNOSIS — M545 Low back pain, unspecified: Secondary | ICD-10-CM

## 2014-06-27 DIAGNOSIS — G4700 Insomnia, unspecified: Secondary | ICD-10-CM

## 2014-06-27 DIAGNOSIS — Z9989 Dependence on other enabling machines and devices: Principal | ICD-10-CM

## 2014-06-27 DIAGNOSIS — G4730 Sleep apnea, unspecified: Secondary | ICD-10-CM

## 2014-06-27 DIAGNOSIS — G4733 Obstructive sleep apnea (adult) (pediatric): Secondary | ICD-10-CM | POA: Insufficient documentation

## 2014-06-27 HISTORY — DX: Low back pain, unspecified: M54.50

## 2014-06-27 MED ORDER — SUVOREXANT 15 MG PO TABS: 15.0000 mg | ORAL_TABLET | Freq: Every evening | ORAL | Status: AC

## 2014-06-27 NOTE — Progress Notes (Signed)
[facility]   Provider:  Melvyn Novas, M D  Referring Provider: Pearson Grippe, MD Primary Care Physician:  Pearson Grippe, MD  Chief Complaint  Patient presents with  . RV sleep cpap    Rm 10, alone    HPI:  Maureen Burch is a 68 y.o. female  Is seen here as a referral from Dr. Selena Batten for follow up on sleep apnea.   Mrs. Shinkareva reports having good success with her CPAP machine and is highly compliant. Her problem is back pain and insomnia- Not necessarily related.  I "hears a sleep study from 3 years ago. On 04-02-11 Dr. Dora Sims referred the patient will had a past medical history of stroke, hypertension, hypothyroidism, lumbar go and she was diagnosed with mild obstructive sleep apnea her Epworth sleepiness score was endorsed at 6 points the Becks inventory for depression at 11 point that time her BMI was 31. She had normal prestudy blood pressures her sleep efficiency was only 48.7% her RDI was 10.1 but REM AHI was 65.9 she was therefore deemed not to be a candidate for a dental device as REM dependent apnea does not respond that well. She was started on CPAP and on only 6 cm water pressure has reduced her AHI to 1.2 I'm able today to review a download over the last 30 days was 93% compliance for days of use, and 67% compliance for over 4 hours of use the average time of CPAP use is 5 hours and 22 minutes. She does not use an EPR setting but she has quite significant intermittent air leaks. He does notice that air escapes not just does the airleak cost noise which interrupts her sleep she also states that gives her difficulties to fall asleep in the first place. May be part of her insomnia could be related to a not perfectly fitting mask. She reports no rhinitis or nasal septal deviation difficulties to breathe through the nose. Today's fatigue severity scale was endorsed at 19 points, the Epworth sleepiness score at 2 points so she reads overall good benefits from her CPAP use. I  would like for her to try 2 different approaches   #1 I will prescribe Belsomra  and give her some samples today is a non-addictive sleep medication.  #2 I would like to change her nasal mask to a dream where mask which has a central hole was on top of the head gear and has no for had strep.  This is usually considered a quiet and comfortable mask fit especially in patients that suffer from headaches.  She bought a second CPAP to keep in [place], needs to use a modem.   She has about one night a week when she has a sleep latency of 2-3 hours, and she takes Ambien generic in this case. I think she is concerned about addiction.  She recently went to [facility] and needed to use a wheelchair, in August - November she helped her daughter with a newborn grandchild and felt exhausted.     Review of Systems: Out of a complete 14 system review, the patient complains of only the following symptoms, and all other reviewed systems are negative.   Epworth score  2 , Fatigue severity score 19  , depression score 2    History   Social History  . Marital Status: Married    Spouse Name: N/A    Number of Children: N/A  . Years of Education: N/A   Occupational History  .  Retired   . 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    Social History Main Topics  . Smoking status: Former Smoker -- 0.50 packs/day    Types: Cigarettes    Quit date: 07/28/2009  . Smokeless tobacco: Never Used     Comment: Intermittant smoker x 40 years.  . Alcohol Use: No  . Drug Use: No  . Sexual Activity: No   Other Topics Concern  . Not on file   Social History Narrative   Married.  [place]  Ambulates independently.    Family History  Problem Relation Age of Onset  . Breast cancer Father   . Breast cancer Sister     Past Medical History  Diagnosis Date  . HTN (hypertension)   . CVA (cerebral infarction)   . Hyperlipidemia   . Hypothyroidism   . Anxiety   . OSA (obstructive sleep  apnea)     Uses CPAP Q HS  . Complication of anesthesia     low blood pressure with major surgery  . Back pain at L4-L5 level 06/27/2014    Past Surgical History  Procedure Laterality Date  . Abdominal hysterectomy    . Tonsillectomy    . Carpal tunnel release      x 2 bilateral  . Total hip arthroplasty    . Revision total hip arthroplasty    . Varicose vein surgery      Current Outpatient Prescriptions  Medication Sig Dispense Refill  . ALPRAZolam (XANAX) 0.25 MG tablet Take 0.25 mg by mouth 2 (two) times daily. anxiety    . amitriptyline (ELAVIL) 50 MG tablet Take 25 mg by mouth at bedtime.     Marland Kitchen. amLODipine (NORVASC) 2.5 MG tablet Take 2.5 mg by mouth daily.     Marland Kitchen. aspirin EC 81 MG tablet Take 81 mg by mouth daily.    Marland Kitchen. atenolol (TENORMIN) 25 MG tablet Take 25 mg by mouth 3 (three) times daily.    . Cholecalciferol (VITAMIN D-3) 5000 UNITS TABS Take by mouth every morning.    . Coenzyme Q10 (CO Q 10) 60 MG CAPS Take by mouth every morning.    . hydrochlorothiazide (HYDRODIURIL) 25 MG tablet Take 25 mg by mouth daily.    Marland Kitchen. KRILL OIL OMEGA-3 PO Take by mouth every morning.    Marland Kitchen. levothyroxine (SYNTHROID, LEVOTHROID) 75 MCG tablet Take 75 mcg by mouth daily.    Marland Kitchen. lisinopril (PRINIVIL,ZESTRIL) 20 MG tablet Take 20 mg by mouth 2 (two) times daily.    . Nutritional Supplements (ESTROVEN ENERGY) TABS Take by mouth every morning.    . Omega-3 Fatty Acids (OMEGA-3 FISH OIL PO) Take by mouth.    . simvastatin (ZOCOR) 10 MG tablet Take 10 mg by mouth at bedtime.    . Suvorexant (BELSOMRA) 15 MG TABS Take 15 mg by mouth Nightly. 10 tablet 0   No current facility-administered medications for this visit.    Allergies as of 06/27/2014  . (No Known Allergies)    Vitals: BP 90/61 mmHg  Pulse 63  Resp 14  Ht 5' 5.5" (1.664 m)  Wt 164 lb (74.39 kg)  BMI 26.87 kg/m2 Last Weight:  Wt Readings from Last 1 Encounters:  06/27/14 164 lb (74.39 kg)       Last Height:   Ht Readings from  Last 1 Encounters:  06/27/14 5' 5.5" (1.664 m)    Physical exam:  General: The patient is awake, alert and appears not in acute distress. The patient  is well groomed. Head: Normocephalic, atraumatic. Neck is supple. Mallampati 3,  neck circumference: 14.25  Nasal airflow unrestricted , septal deviation is noted, but does not restrict , TMJ is not  evident . Retrognathia is not seen.  Cardiovascular:  Regular rate and rhythm , without  murmurs or carotid bruit, and without distended neck veins. Respiratory: Lungs are clear to auscultation. Skin:  Without evidence of edema, or rash Trunk: BMI is not  elevated and patient  has pain related change of  posture.  Neurologic exam : The patient is awake and alert, oriented to place and time.   Memory subjective described as intact.  There is a normal attention span & concentration ability.  Speech is fluent without dysarthria, dysphonia or aphasia.  Mood and affect are appropriate.  Cranial nerves: Pupils are equal and briskly reactive to light. Funduscopic exam without evidence of pallor or edema.  Extraocular movements  in vertical and horizontal planes intact and without nystagmus. Visual fields by finger perimetry are intact. Hearing to finger rub intact.  Facial sensation intact to fine touch. Facial motor strength is symmetric and tongue and uvula move midline.  Motor exam:  Normal tone ,muscle bulk and symmetric ,strength in all extremities.  Sensory:  Fine touch, pinprick and vibration were tested in all extremities.  Proprioception is tested in the upper extremities only. This was  normal.  Coordination: Rapid alternating movements in the fingers/hands is normal.  Finger-to-nose maneuver  normal without evidence of ataxia, dysmetria or tremor.  Gait and station: Patient walks without assistive device and is able unassisted to climb up to the exam table. Strength within normal limits. Stance is stable and normal. Tandem gait is  unfragmented. Romberg testing is negative.  Deep tendon reflexes: in the upper and lower extremities are symmetric and  downgoing.   Assessment:  After physical and neurologic examination, review of laboratory studies, imaging, neurophysiology testing and pre-existing records, assessment is  1) OSA , well controlled mild sleep apnea. Settings remain the same, but the patient will try an dream ware mask. 2)Insomnia - belsomra.  3) back pain , Dr Danae OrleansPenumali is her primary neurologist.   The patient was advised of the nature of the diagnosed sleep disorder , the treatment options and risks for general a health and wellness arising from not treating the condition. Visit duration was 35 minutes.   Plan:  Treatment plan and additional workup : Change to Belsomra and dream ware mask. Respironics.    Porfirio Mylararmen Lakiya Cottam MD  06/27/2014
